# Patient Record
Sex: Female | Born: 1975 | Race: White | Hispanic: No | Marital: Married | Smoking: Former smoker
Health system: Southern US, Community
[De-identification: ages and names within clinical notes are randomized; demographics above are authoritative.]

## PROBLEM LIST (undated history)

## (undated) DIAGNOSIS — F329 Major depressive disorder, single episode, unspecified: Secondary | ICD-10-CM

## (undated) DIAGNOSIS — O039 Complete or unspecified spontaneous abortion without complication: Secondary | ICD-10-CM

## (undated) DIAGNOSIS — F32A Depression, unspecified: Secondary | ICD-10-CM

## (undated) DIAGNOSIS — G43909 Migraine, unspecified, not intractable, without status migrainosus: Secondary | ICD-10-CM

## (undated) DIAGNOSIS — F419 Anxiety disorder, unspecified: Secondary | ICD-10-CM

## (undated) DIAGNOSIS — F909 Attention-deficit hyperactivity disorder, unspecified type: Secondary | ICD-10-CM

## (undated) HISTORY — PX: OVARIAN CYST REMOVAL: SHX89

## (undated) HISTORY — DX: Complete or unspecified spontaneous abortion without complication: O03.9

## (undated) HISTORY — DX: Attention-deficit hyperactivity disorder, unspecified type: F90.9

## (undated) HISTORY — DX: Anxiety disorder, unspecified: F41.9

## (undated) HISTORY — DX: Major depressive disorder, single episode, unspecified: F32.9

## (undated) HISTORY — DX: Depression, unspecified: F32.A

## (undated) HISTORY — DX: Migraine, unspecified, not intractable, without status migrainosus: G43.909

## (undated) HISTORY — PX: DILATION AND CURETTAGE OF UTERUS: SHX78

## (undated) HISTORY — PX: OOPHORECTOMY: SHX86

---

## 1998-06-18 ENCOUNTER — Other Ambulatory Visit: Admission: RE | Admit: 1998-06-18 | Discharge: 1998-06-18 | Payer: Self-pay | Admitting: Obstetrics & Gynecology

## 1999-07-13 DIAGNOSIS — O039 Complete or unspecified spontaneous abortion without complication: Secondary | ICD-10-CM

## 1999-07-13 HISTORY — DX: Complete or unspecified spontaneous abortion without complication: O03.9

## 2000-01-20 ENCOUNTER — Other Ambulatory Visit: Admission: RE | Admit: 2000-01-20 | Discharge: 2000-01-20 | Payer: Self-pay | Admitting: Obstetrics & Gynecology

## 2000-01-27 ENCOUNTER — Encounter: Admission: RE | Admit: 2000-01-27 | Discharge: 2000-01-27 | Payer: Self-pay | Admitting: Obstetrics & Gynecology

## 2000-01-27 ENCOUNTER — Encounter: Payer: Self-pay | Admitting: Obstetrics & Gynecology

## 2000-02-11 ENCOUNTER — Ambulatory Visit (HOSPITAL_COMMUNITY): Admission: RE | Admit: 2000-02-11 | Discharge: 2000-02-11 | Payer: Self-pay | Admitting: Obstetrics & Gynecology

## 2000-02-11 ENCOUNTER — Encounter: Payer: Self-pay | Admitting: Obstetrics & Gynecology

## 2000-03-23 ENCOUNTER — Ambulatory Visit (HOSPITAL_COMMUNITY): Admission: RE | Admit: 2000-03-23 | Discharge: 2000-03-23 | Payer: Self-pay | Admitting: Obstetrics & Gynecology

## 2000-03-23 ENCOUNTER — Encounter (INDEPENDENT_AMBULATORY_CARE_PROVIDER_SITE_OTHER): Payer: Self-pay | Admitting: Specialist

## 2001-07-03 ENCOUNTER — Inpatient Hospital Stay (HOSPITAL_COMMUNITY): Admission: AD | Admit: 2001-07-03 | Discharge: 2001-07-03 | Payer: Self-pay | Admitting: Obstetrics & Gynecology

## 2001-07-10 ENCOUNTER — Inpatient Hospital Stay (HOSPITAL_COMMUNITY): Admission: AD | Admit: 2001-07-10 | Discharge: 2001-07-12 | Payer: Self-pay | Admitting: Obstetrics and Gynecology

## 2001-08-11 ENCOUNTER — Other Ambulatory Visit: Admission: RE | Admit: 2001-08-11 | Discharge: 2001-08-11 | Payer: Self-pay | Admitting: Obstetrics & Gynecology

## 2001-11-30 ENCOUNTER — Encounter: Payer: Self-pay | Admitting: Orthopedic Surgery

## 2001-11-30 ENCOUNTER — Ambulatory Visit (HOSPITAL_COMMUNITY): Admission: RE | Admit: 2001-11-30 | Discharge: 2001-11-30 | Payer: Self-pay | Admitting: Orthopedic Surgery

## 2002-02-04 ENCOUNTER — Ambulatory Visit (HOSPITAL_COMMUNITY): Admission: RE | Admit: 2002-02-04 | Discharge: 2002-02-04 | Payer: Self-pay | Admitting: *Deleted

## 2002-02-05 ENCOUNTER — Ambulatory Visit (HOSPITAL_COMMUNITY): Admission: RE | Admit: 2002-02-05 | Discharge: 2002-02-05 | Payer: Self-pay | Admitting: Internal Medicine

## 2002-02-05 ENCOUNTER — Encounter: Payer: Self-pay | Admitting: Internal Medicine

## 2002-03-02 ENCOUNTER — Encounter: Admission: RE | Admit: 2002-03-02 | Discharge: 2002-03-02 | Payer: Self-pay | Admitting: Internal Medicine

## 2002-03-02 ENCOUNTER — Encounter: Payer: Self-pay | Admitting: Internal Medicine

## 2002-06-21 ENCOUNTER — Encounter (INDEPENDENT_AMBULATORY_CARE_PROVIDER_SITE_OTHER): Payer: Self-pay

## 2002-06-21 ENCOUNTER — Ambulatory Visit (HOSPITAL_COMMUNITY): Admission: AD | Admit: 2002-06-21 | Discharge: 2002-06-21 | Payer: Self-pay | Admitting: Obstetrics and Gynecology

## 2002-07-18 ENCOUNTER — Other Ambulatory Visit: Admission: RE | Admit: 2002-07-18 | Discharge: 2002-07-18 | Payer: Self-pay | Admitting: Obstetrics and Gynecology

## 2003-12-16 ENCOUNTER — Other Ambulatory Visit: Admission: RE | Admit: 2003-12-16 | Discharge: 2003-12-16 | Payer: Self-pay | Admitting: Obstetrics and Gynecology

## 2005-01-27 ENCOUNTER — Other Ambulatory Visit: Admission: RE | Admit: 2005-01-27 | Discharge: 2005-01-27 | Payer: Self-pay | Admitting: Obstetrics and Gynecology

## 2005-01-28 ENCOUNTER — Encounter (INDEPENDENT_AMBULATORY_CARE_PROVIDER_SITE_OTHER): Payer: Self-pay | Admitting: *Deleted

## 2005-01-28 ENCOUNTER — Other Ambulatory Visit: Admission: RE | Admit: 2005-01-28 | Discharge: 2005-01-28 | Payer: Self-pay | Admitting: Obstetrics and Gynecology

## 2005-01-28 ENCOUNTER — Ambulatory Visit (HOSPITAL_COMMUNITY): Admission: RE | Admit: 2005-01-28 | Discharge: 2005-01-28 | Payer: Self-pay | Admitting: Obstetrics and Gynecology

## 2006-08-04 ENCOUNTER — Encounter (INDEPENDENT_AMBULATORY_CARE_PROVIDER_SITE_OTHER): Payer: Self-pay | Admitting: Specialist

## 2006-08-04 ENCOUNTER — Ambulatory Visit (HOSPITAL_BASED_OUTPATIENT_CLINIC_OR_DEPARTMENT_OTHER): Admission: RE | Admit: 2006-08-04 | Discharge: 2006-08-04 | Payer: Self-pay | Admitting: Obstetrics and Gynecology

## 2009-02-06 ENCOUNTER — Ambulatory Visit (HOSPITAL_COMMUNITY): Admission: RE | Admit: 2009-02-06 | Discharge: 2009-02-06 | Payer: Self-pay | Admitting: Obstetrics and Gynecology

## 2009-02-06 ENCOUNTER — Encounter (INDEPENDENT_AMBULATORY_CARE_PROVIDER_SITE_OTHER): Payer: Self-pay | Admitting: Obstetrics and Gynecology

## 2010-08-02 ENCOUNTER — Encounter: Payer: Self-pay | Admitting: Obstetrics and Gynecology

## 2010-10-18 LAB — CBC
Hemoglobin: 14.1 g/dL (ref 12.0–15.0)
MCHC: 34.1 g/dL (ref 30.0–36.0)
RBC: 4.22 MIL/uL (ref 3.87–5.11)
RDW: 12.3 % (ref 11.5–15.5)
WBC: 7.3 10*3/uL (ref 4.0–10.5)

## 2010-10-18 LAB — PREGNANCY, URINE: Preg Test, Ur: NEGATIVE

## 2010-11-24 NOTE — Op Note (Signed)
Deborah Mcpherson, Deborah Mcpherson               ACCOUNT NO.:  [id number]   MEDICAL RECORD NO.:  [id number]          PATIENT TYPE:  AMB   LOCATION:  [facility]                           FACILITY:  [facility]   PHYSICIAN:  Malva Limes, M.D.    DATE OF BIRTH:  Dec 25, 1975   DATE OF PROCEDURE:  02/06/2009  DATE OF DISCHARGE:  02/06/2009                               OPERATIVE REPORT   PREOPERATIVE DIAGNOSES:  1. Painful right ovarian cyst.  2. History of recurrent ovarian cyst.   POSTOPERATIVE DIAGNOSES:  1. Painful right ovarian cyst.  2. History of recurrent ovarian cyst.   PROCEDURES:  1. Diagnostic laparoscopy.  2. Right ovarian cystectomy.  3. Placement of Mirena intrauterine device.   SURGEON:  Malva Limes, MD   ANESTHESIA:  General.   ANTIBIOTICS:  Ancef 1 g.   DRAINS:  Red rubber catheter to the bladder.   SPECIMEN:  Cyst wall sent to Pathology.   DESCRIPTION OF PROCEDURE:  The patient was taken to the operating room,  where general anesthetic was administered without complications.  She  was then placed in dorsal lithotomy position.  She was prepped and  draped in the usual fashion for this procedure.  Her bladder was drained  with a red rubber catheter.  A Hulka tenaculum was applied to the  anterior cervical lip.  Her umbilicus was injected with 0.25% Marcaine.  A vertical skin incision was made in the area of the previous scar.  The  fascia was grasped, entered sharply with Mayo scissors.  The parietal  peritoneum was grasped and also entered sharply with Mayo scissors.  0  Vicryl suture was then placed in a purse-string fashion.  The Hasson  cannula was then placed in the abdominal cavity and 3 L of carbon  dioxide was insufflated.  The liver and gallbladder appeared to be  normal.  The appendix was not visualized.  The patient had no evidence  of any adhesions or endometriosis.  Uterus appeared to be normal.  Her  left ovary was surgically absent.  The right ovary appeared to be  enlarged, somewhat boggy, and a cyst was noted deep inside the ovary.  The right fallopian tube was normal.  It appeared that there was a small  hydatid cyst of Morgagni on the left.  At this point, a 5-mm trocar was  placed in the suprapubic region.  The ovaries grasped and the serosa of  the ovary entered with the scissors.  During the dissection of the cyst  from the ovary,  the cyst was ruptured and serosanguineous fluid was  noted.  At this point, the single tooth graspers were used to grab the  ovarian cyst and removed the cyst wall from the ovary.  Bleeding was  minimal.  The ovary was freed and appeared to be only having minimal  bleeding.  This concluded the laparoscopic procedure.  The instruments  were removed.  Pneumoperitoneum was released.  The fascia was closed  with the previous 0 Vicryl suture placed in a purse-string fashion.  The  skin was closed with interrupted 3-0 Vicryl sutures in  subcuticular  fashion.  The 5-mm port was closed with Dermabond.  At this point, the  Hulka tenaculum was removed.  A single-tooth tenaculum was applied in  the anterior cervical lip.  The uterus was sounded to 8 cm.  The Mirena  IUD placed into the uterine cavity.  The string was trimmed and the  procedure concluded.  The patient tolerated the procedure well.  She was  taken to recovery room in stable condition.  Instrument and counts  correct x1.  The patient was discharged to home.  She will follow up in  the office in 4 weeks.           ______________________________  Malva Limes, M.D.     MA/MEDQ  D:  02/06/2009  T:  02/07/2009  Job:  [id number]

## 2010-11-27 NOTE — Op Note (Signed)
Deborah Mcpherson, Deborah Mcpherson               ACCOUNT NO.:  [id number]   MEDICAL RECORD NO.:  [id number]          PATIENT TYPE:  AMB   LOCATION:  [facility]C                           FACILITY:  [facility]   PHYSICIAN:  Malva Limes, M.D.    DATE OF BIRTH:  May 16, 1976   DATE OF PROCEDURE:  01/28/2005  DATE OF DISCHARGE:                                 OPERATIVE REPORT   PREOPERATIVE DIAGNOSIS:  Menorrhagia.   POSTOPERATIVE DIAGNOSIS:  Menorrhagia.   PROCEDURE:  Dilation and curettage.   SURGEON:  Malva Limes, M.D.   ANESTHESIA:  MAC with paracervical block.   DRAINS:  None.   ANTIBIOTICS:  Ancef 1 g.   COMPLICATIONS:  None.   SPECIMENS:  Endometrial curettings sent to pathology.   ESTIMATED BLOOD LOSS:  20 mL.   PROCEDURE:  The patient was taken to the operating room, where she was  placed in the dorsal lithotomy position.  She was prepped with Betadine and  draped in the usual fashion for this procedure.  A sterile speculum was  placed in the vagina and 20 mL of 1% lidocaine was used for a paracervical  block.  The cervix was then serially dilated to a 23 [place].  The uterus was  sounded to 7 cm.  Sharp curettage was then performed.  There was no evidence  of any kind of polyps or submucosal fibroids.  The patient tolerated the  procedure well, and she was taken to the recovery room in stable condition.  Instrument and lap counts correct x2.  The patient will be discharged home.  She will be sent home with Motrin 600 mg p.o. q.6h. p.r.n.       MA/MEDQ  D:  01/28/2005  T:  01/28/2005  Job:  [id number]

## 2010-11-27 NOTE — Op Note (Signed)
[name], Deborah Mcpherson               ACCOUNT NO.:  [id number]   MEDICAL RECORD NO.:  [id number]          PATIENT TYPE:  AMB   LOCATION:  [facility]                         FACILITY:  [facility]   PHYSICIAN:  Malva Limes, M.D.    DATE OF BIRTH:  1976/03/29   DATE OF PROCEDURE:  08/04/2006  DATE OF DISCHARGE:                               OPERATIVE REPORT   PREOPERATIVE DIAGNOSES:  1. Pelvic pain.  2. Recurring left ovarian cyst.   POSTOPERATIVE DIAGNOSES:  1. Pelvic pain.  2. Recurring left ovarian cyst.   PROCEDURE:  Left oophorectomy.   SURGEON:  Malva Limes, M.D.   ANESTHESIA:  General endotracheal.   ANTIBIOTICS:  Ancef 1 g.   DRAINS:  Red rubber catheter to bladder.   ESTIMATED BLOOD LOSS:  Minimal.   SPECIMENS:  Left ovary, sent to Pathology.   COMPLICATIONS:  None.   PROCEDURE:  The patient was taken to the operating room, where she was  placed in the dorsal supine position.  A general anesthetic was  administered without complication.  She was then placed in the dorsal  lithotomy position.  She was prepped with Hibiclens and draped in the  usual fashion for this procedure.  Her bladder was drained with a red  rubber catheter.  A Hulka tenaculum was applied to the anterior cervical  lip.  At this point, 7 mL of 0.5% Marcaine were injected into the  umbilicus.  A vertical skin incision was made; this was carried down to  the fascia. The fascia was grasped with Kochers, opened with the Mayos  and 0 Vicryl suture placed.  The parietal peritoneum was entered  bluntly.  The Hasson cannula was then placed into the peritoneal cavity  and 3 L of carbon dioxide were insufflated.  The patient was then placed  in Trendelenburg.  A 5-mm port was placed in the suprapubic region under  direct visualization.  A 5-mm port was also placed in the left lower  quadrant under direct visualization.  On examination, the patient was  found to have a 3-cm cyst involving the left ovary.  There  was no  evidence of any endometriosis or adhesions.  The ovarian cyst appeared  to be simple and benign.  Prior to having this procedure performed, it  was suggested to the patient that she not have this surgery and to  follow this because it was likely a follicular cyst.  Also, she was  advised to have an ovarian cystectomy.  The patient states that she  continues to have these and desired an oophorectomy.  The ovary was then  grasped and the tripolar instrument used.  The ovarian pedicle was then  cauterized and transected.  The mesosalpinx was then cauterized and  transected.  The ovary was then placed in the posterior cul-de-sac.  The  5-mm trocar was exchanged with a 10-mm trocar in the suprapubic region.  An Endopouch was placed into the abdominal cavity, the ovary placed into  the Endopouch and then pulled up through the abdominal wall.  Once this  was performed, the pelvis was copiously irrigated and  there was no  evidence of any bleeding.  This concluded the procedure.  The  instruments and trocars were removed.  The fascia was closed with 0  Vicryl suture, the skin with Dermabond.  The patient tolerated the  procedure well.  She was extubated and taken to the recovery room in  stable condition.   She will be discharged to home.  She will be sent home with Percocet to  take p.r.n.           ______________________________  Malva Limes, M.D.     MA/MEDQ  D:  08/04/2006  T:  08/04/2006  Job:  [id number]

## 2010-11-27 NOTE — Op Note (Signed)
NAME:  Deborah Mcpherson, Deborah Mcpherson                         ACCOUNT NO.:  [id number]   MEDICAL RECORD NO.:  [id number]                   PATIENT TYPE:  AMB   LOCATION:  [facility]                                 FACILITY:  [facility]   PHYSICIAN:  Malva Limes, M.D.                 DATE OF BIRTH:  August 24, 1975   DATE OF PROCEDURE:  06/21/2002  DATE OF DISCHARGE:                                 OPERATIVE REPORT   PREOPERATIVE DIAGNOSIS:  Painful left ovarian simple cyst, 5 cm.   POSTOPERATIVE DIAGNOSIS:  Painful left ovarian simple cyst, 5 cm.   PROCEDURES:  1. Diagnostic laparoscopy.  2. Left ovarian cystectomy.   SURGEON:  Malva Limes, M.D.   ANESTHESIA:  General endotracheal.   ANTIBIOTICS:  Ancef 1 g.   DRAINS:  Red rubber catheter to bladder.   ESTIMATED BLOOD LOSS:  Minimal.   SPECIMENS:  Cyst sent to pathology.   FINDINGS:  The patient had normal-appearing liver.  The gallbladder was not  visualized.  In the pelvis the patient had no evidence of endometriosis or  adhesions.  The patient did have a left ovarian cyst.  There was normal  serosal cover on the ovary.  The right ovary appeared to be normal.   INDICATIONS:  The patient is a 35 year old white female, G2, P1, who  presented to the office approximately three days ago complaining of  significant pelvic pain.  An ultrasound was performed, which revealed a 5 cm  simple cyst.  The patient was given the option of observing this with pain  medication versus surgery.  Initially the patient decided to follow the cyst  and 24 hours later changed her mind and elected to have surgery and removal  of the cyst.   DESCRIPTION OF PROCEDURE:  The patient was taken to the operating room,  where a general anesthetic was administered without complication.  She was  then placed in the dorsal lithotomy position and prepped with Hibiclens.  Her bladder was drained with a red rubber catheter.  The patient was draped  in the usual fashion for this  procedure.  A Hulka tenaculum was applied to  the anterior cervical lip.  The umbilicus was then injected with 0.25%  Marcaine, a vertical skin incision was made, the Veress needle was placed in  the peritoneal cavity, and 3 L of carbon dioxide was insufflated.  The 10 mm  trocar was then placed in the peritoneal cavity.  The scope was then placed,  and the patient was placed in Trendelenburg.  Then a 5 mm port was placed in  the suprapubic region and in the left lower quadrant under direct  visualization.  At this point the ovarian cyst was grasped and incised with  the scissors.  Following this the cyst was shelled out of the ovary and  removed.  It was drained and then pulled through the scope and sent to  pathology.  The incision in the ovary was left open.  A small amount of  blood in the posterior cul-de-sac  was evacuated.  This concluded the procedure.  The instruments were removed  and pneumoperitoneum released.  The skin incisions were closed with  interrupted 4-0 Vicryl  suture.  The patient was extubated and taken to the  recovery room in stable condition.  The instrument and lap counts were  correct x2.                                               Malva Limes, M.D.    MA/MEDQ  D:  06/21/2002  T:  06/22/2002  Job:  [id number]

## 2010-11-27 NOTE — Op Note (Signed)
[facility]  Patient:    Deborah Mcpherson, Deborah Mcpherson                      MRN: [id number] Proc. Date: 03/23/00 Attending:  Lars Pinks                           Operative Report  PREOPERATIVE DIAGNOSIS:       Missed abortion.  POSTOPERATIVE DIAGNOSIS:      Missed abortion.  OPERATION:                    Dilatation and evacuation.  SURGEON:                      Richard D. Arlyce Dice, M.D.  ASSISTANT:  ANESTHESIA:                   Paracervical block with IV sedation.  ESTIMATED BLOOD LOSS:         20 cc.  FINDINGS:                     Products of conception consistent with a 10-week                               missed AB.  INDICATIONS:                  This is a 35 year old gravida 1 who presented to the office today at [redacted] weeks gestation and was found on ultrasound to have an eight week fetus with no fetal heart tones seen.  The patient had a previous ultrasound four weeks ago at which time a seven week fetus with a positive fetal heart tone was identified.  Since a follow-up ultrasound was done four weeks later and showed poor growth of fetal pole and no fetal heart tones, it was felt that this was consistent with a diagnosis of an inevitable for missed abortion.  DESCRIPTION OF PROCEDURE:     The patient was taken to the operating room and placed in the dorsal lithotomy position.  IV sedation and a paracervical block was placed.  The anterior lip of the cervix was grasped with single-tooth tenaculum.  The internal os was dilated with Shawnie Pons dilators to a #25 and #8 suction curette was introduced into the uterine cavity.  Suction curettage was then performed until the uterine cavity was emptied.  The procedure was then terminated and the patient left the operating room in good condition. DD:  03/23/00 TD:  03/25/00 Job: [id number]

## 2011-01-07 ENCOUNTER — Other Ambulatory Visit: Payer: Self-pay | Admitting: Obstetrics and Gynecology

## 2013-01-04 ENCOUNTER — Other Ambulatory Visit: Payer: Self-pay | Admitting: Obstetrics and Gynecology

## 2013-04-30 ENCOUNTER — Encounter (INDEPENDENT_AMBULATORY_CARE_PROVIDER_SITE_OTHER): Payer: Self-pay

## 2013-04-30 ENCOUNTER — Ambulatory Visit (INDEPENDENT_AMBULATORY_CARE_PROVIDER_SITE_OTHER): Payer: BC Managed Care – PPO | Admitting: Family Medicine

## 2013-04-30 ENCOUNTER — Encounter: Payer: Self-pay | Admitting: Family Medicine

## 2013-04-30 VITALS — BP 122/74 | HR 83 | Temp 98.0°F | Resp 18 | Ht 68.0 in | Wt 175.0 lb

## 2013-04-30 DIAGNOSIS — J029 Acute pharyngitis, unspecified: Secondary | ICD-10-CM

## 2013-04-30 DIAGNOSIS — J209 Acute bronchitis, unspecified: Secondary | ICD-10-CM

## 2013-04-30 MED ORDER — HYDROCODONE-HOMATROPINE 5-1.5 MG/5ML PO SYRP: ORAL_SOLUTION | ORAL | Status: DC

## 2013-04-30 NOTE — Patient Instructions (Signed)
Saline nasal spray: 2-3 sprays each nostril as needed during the day, esp before bedtime.  OTC mucinex as directed.  Use the prescription cough med at bedtime preferably.  Drink plenty of clear fluids and REST.

## 2013-04-30 NOTE — Assessment & Plan Note (Signed)
Saline nasal spray: 2-3 sprays each nostril as needed during the day, esp before bedtime.  OTC mucinex as directed.  Use the prescription cough med at bedtime preferably.  Drink plenty of clear fluids and REST.  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

## 2013-04-30 NOTE — Progress Notes (Addendum)
Office Note 04/30/2013  CC:  Chief Complaint  Patient presents with  . Cough    chest congestion x 2 days  . Sore Throat    HPI:  Deborah Mcpherson is a 37 y.o. White female who is here to establish care and discuss respiratory symptoms. Patient's most recent primary MD: Dr. Yehuda Budd Old records were not reviewed prior to or during today's visit.  About 48h ST, sinus drainage, coughing a lot, voice going out.  ST improving.  No fever. No rash.  Some HA yesterday due to lots of coughing.  No face or upper teeth pain. No n/v/d.  No wheezing, chest tightness, or SOB. Tried mucinex tabs--no help.   Past Medical History  Diagnosis Date  . Depression   . ADHD (attention deficit hyperactivity disorder)   . Anxiety   . Migraine syndrome     stress is trigger    Past Surgical History  Procedure Laterality Date  . Oophorectomy Left     tube and ovary removed--all benign  . Ovarian cyst removal      x 3    Family History  Problem Relation Age of Onset  . Cancer Father     LUNG  . Alzheimer's disease Maternal Grandfather     History   Social History  . Marital Status: Married    Spouse Name: N/A    Number of Children: N/A  . Years of Education: N/A   Occupational History  . Not on file.   Social History Main Topics  . Smoking status: Former Smoker    Types: Cigarettes    Quit date: 04/29/2009  . Smokeless tobacco: Never Used  . Alcohol Use: 0.6 oz/week    1 Glasses of wine per week  . Drug Use: No  . Sexual Activity: Not on file   Other Topics Concern  . Not on file   Social History Narrative   Married, has 66 y/o daughter.   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   Orig from [place] area.   Tob: 5 pack-yr hx, quit 2010.   Alcohol: occ glass of wine.    No drugs.    Outpatient Encounter Prescriptions as of 04/30/2013  Medication Sig Dispense Refill  . alprazolam (XANAX) 2 MG tablet Take 2 mg by mouth at bedtime as needed for sleep.      Marland Kitchen  amphetamine-dextroamphetamine (ADDERALL) 30 MG tablet Take 30 mg by mouth 2 (two) times daily.      Marland Kitchen buPROPion (WELLBUTRIN SR) 150 MG 12 hr tablet Take 150 mg by mouth 2 (two) times daily.      . drospirenone-ethinyl estradiol (YASMIN,ZARAH,SYEDA) 3-0.03 MG tablet Take 1 tablet by mouth daily.      . DULoxetine (CYMBALTA) 60 MG capsule Take 60 mg by mouth daily.      Marland Kitchen HYDROcodone-homatropine (HYCODAN) 5-1.5 MG/5ML syrup 1-2 tsp po q6h prn cough  120 mL  0   No facility-administered encounter medications on file as of 04/30/2013.    Allergies  Allergen Reactions  . Eggs Or Egg-Derived Products Swelling    Tongue and lips swell  . Penicillins Hives    ROS See HPI PE; Blood pressure 122/74, pulse 83, temperature 98 F (36.7 C), temperature source Temporal, resp. rate 18, height 5\' 8"  (1.727 m), weight 175 lb (79.379 kg), last menstrual period 04/11/2013, SpO2 97.00%. VS: noted--normal. Gen: alert, NAD, NONTOXIC APPEARING. HEENT: eyes without injection, drainage, or swelling.  Ears: EACs clear, TMs with normal light reflex and  landmarks.  Nose: Clear rhinorrhea, with some dried, crusty exudate adherent to mildly injected mucosa.  No purulent d/c.  No paranasal sinus TTP.  No facial swelling.  Throat and mouth without focal lesion.  No pharyngial swelling, erythema, or exudate.   Neck: supple, no LAD.   LUNGS: CTA bilat, nonlabored resps.   CV: RRR, no m/r/g. EXT: no c/c/e SKIN: no rash   Pertinent labs:  Rapid strep NEG  ASSESSMENT AND PLAN:   New pt: obtain old records.  Acute bronchitis Saline nasal spray: 2-3 sprays each nostril as needed during the day, esp before bedtime.  OTC mucinex as directed.  Use the prescription cough med at bedtime preferably.  Drink plenty of clear fluids and REST.  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  Throat culture was sent today.  An After Visit Summary was printed and given to the patient.  Return if symptoms worsen or fail to  improve.

## 2013-05-02 LAB — CULTURE, GROUP A STREP: Organism ID, Bacteria: NORMAL

## 2013-05-03 ENCOUNTER — Encounter: Payer: Self-pay | Admitting: Family Medicine

## 2014-01-08 ENCOUNTER — Other Ambulatory Visit: Payer: Self-pay | Admitting: Obstetrics and Gynecology

## 2014-01-09 LAB — CYTOLOGY - PAP

## 2015-01-07 ENCOUNTER — Ambulatory Visit (INDEPENDENT_AMBULATORY_CARE_PROVIDER_SITE_OTHER): Payer: Self-pay | Admitting: Family Medicine

## 2015-01-07 ENCOUNTER — Telehealth: Payer: Self-pay | Admitting: Family Medicine

## 2015-01-07 DIAGNOSIS — Z23 Encounter for immunization: Secondary | ICD-10-CM

## 2015-01-07 NOTE — Telephone Encounter (Signed)
Pt called stating that she dropped a knife on her foot.  She is fine but doesn't think she is up to date on her tdap.  Can we give this as nurse visit?  Please advise.

## 2015-01-07 NOTE — Telephone Encounter (Signed)
yes

## 2015-03-24 ENCOUNTER — Other Ambulatory Visit: Payer: Self-pay | Admitting: Obstetrics and Gynecology

## 2015-03-25 LAB — CYTOLOGY - PAP

## 2015-04-29 ENCOUNTER — Ambulatory Visit (INDEPENDENT_AMBULATORY_CARE_PROVIDER_SITE_OTHER): Payer: BLUE CROSS/BLUE SHIELD | Admitting: Family Medicine

## 2015-04-29 ENCOUNTER — Encounter: Payer: Self-pay | Admitting: Family Medicine

## 2015-04-29 VITALS — BP 113/79 | HR 91 | Temp 97.7°F | Resp 18 | Ht 68.0 in | Wt 171.0 lb

## 2015-04-29 DIAGNOSIS — J019 Acute sinusitis, unspecified: Secondary | ICD-10-CM | POA: Insufficient documentation

## 2015-04-29 DIAGNOSIS — J0100 Acute maxillary sinusitis, unspecified: Secondary | ICD-10-CM

## 2015-04-29 MED ORDER — HYDROCODONE-HOMATROPINE 5-1.5 MG/5ML PO SYRP: 5.0000 mL | ORAL_SOLUTION | Freq: Four times a day (QID) | ORAL | Status: DC | PRN

## 2015-04-29 MED ORDER — DOXYCYCLINE HYCLATE 100 MG PO TABS: 100.0000 mg | ORAL_TABLET | Freq: Two times a day (BID) | ORAL | Status: DC

## 2015-04-29 MED ORDER — BENZONATATE 100 MG PO CAPS: 100.0000 mg | ORAL_CAPSULE | Freq: Two times a day (BID) | ORAL | Status: DC | PRN

## 2015-04-29 MED ORDER — FLUTICASONE PROPIONATE 50 MCG/ACT NA SUSP: 2.0000 | Freq: Every day | NASAL | Status: DC

## 2015-04-29 NOTE — Patient Instructions (Signed)
Sinusitis, Adult Sinusitis is redness, soreness, and inflammation of the paranasal sinuses. Paranasal sinuses are air pockets within the bones of your face. They are located beneath your eyes, in the middle of your forehead, and above your eyes. In healthy paranasal sinuses, mucus is able to drain out, and air is able to circulate through them by way of your nose. However, when your paranasal sinuses are inflamed, mucus and air can become trapped. This can allow bacteria and other germs to grow and cause infection. Sinusitis can develop quickly and last only a short time (acute) or continue over a long period (chronic). Sinusitis that lasts for more than 12 weeks is considered chronic. CAUSES Causes of sinusitis include:  Allergies.  Structural abnormalities, such as displacement of the cartilage that separates your nostrils (deviated septum), which can decrease the air flow through your nose and sinuses and affect sinus drainage.  Functional abnormalities, such as when the small hairs (cilia) that line your sinuses and help remove mucus do not work properly or are not present. SIGNS AND SYMPTOMS Symptoms of acute and chronic sinusitis are the same. The primary symptoms are pain and pressure around the affected sinuses. Other symptoms include:  Upper toothache.  Earache.  Headache.  Bad breath.  Decreased sense of smell and taste.  A cough, which worsens when you are lying flat.  Fatigue.  Fever.  Thick drainage from your nose, which often is green and may contain pus (purulent).  Swelling and warmth over the affected sinuses. DIAGNOSIS Your health care provider will perform a physical exam. During your exam, your health care provider may perform any of the following to help determine if you have acute sinusitis or chronic sinusitis:  Look in your nose for signs of abnormal growths in your nostrils (nasal polyps).  Tap over the affected sinus to check for signs of  infection.  View the inside of your sinuses using an imaging device that has a light attached (endoscope). If your health care provider suspects that you have chronic sinusitis, one or more of the following tests may be recommended:  Allergy tests.  Nasal culture. A sample of mucus is taken from your nose, sent to a lab, and screened for bacteria.  Nasal cytology. A sample of mucus is taken from your nose and examined by your health care provider to determine if your sinusitis is related to an allergy. TREATMENT Most cases of acute sinusitis are related to a viral infection and will resolve on their own within 10 days. Sometimes, medicines are prescribed to help relieve symptoms of both acute and chronic sinusitis. These may include pain medicines, decongestants, nasal steroid sprays, or saline sprays. However, for sinusitis related to a bacterial infection, your health care provider will prescribe antibiotic medicines. These are medicines that will help kill the bacteria causing the infection. Rarely, sinusitis is caused by a fungal infection. In these cases, your health care provider will prescribe antifungal medicine. For some cases of chronic sinusitis, surgery is needed. Generally, these are cases in which sinusitis recurs more than 3 times per year, despite other treatments. HOME CARE INSTRUCTIONS  Drink plenty of water. Water helps thin the mucus so your sinuses can drain more easily.  Use a humidifier.  Inhale steam 3-4 times a day (for example, sit in the bathroom with the shower running).  Apply a warm, moist washcloth to your face 3-4 times a day, or as directed by your health care provider.  Use saline nasal sprays to help   moisten and clean your sinuses.  Take medicines only as directed by your health care provider.  If you were prescribed either an antibiotic or antifungal medicine, finish it all even if you start to feel better. SEEK IMMEDIATE MEDICAL CARE IF:  You have  increasing pain or severe headaches.  You have nausea, vomiting, or drowsiness.  You have swelling around your face.  You have vision problems.  You have a stiff neck.  You have difficulty breathing.   This information is not intended to replace advice given to you by your health care provider. Make sure you discuss any questions you have with your health care provider.   Document Released: 06/28/2005 Document Revised: 07/19/2014 Document Reviewed: 07/13/2011 Elsevier Interactive Patient Education 2016 [employer] Doxycycline  10 days, Mucinex, flonase Tessalon Perles for cough in the day Cough syrup at night Nasal saline at least 3 times a day

## 2015-04-29 NOTE — Progress Notes (Signed)
Pre visit review using our clinic review tool, if applicable. No additional management support is needed unless otherwise documented below in the visit note. 

## 2015-04-29 NOTE — Progress Notes (Signed)
   Subjective:    Patient ID: Deborah Mcpherson, female    DOB: [birth year], 39 y.o.   MRN: [id number]  HPI  Congestion: patient stated last week she experienced nasal congestion and rhinorrhea that went away. She was exposed to her neighbors children at that time that had been ill. She started to feel better and then Sunday she experienced sinus pressure right max. Sinus and Monday cough. She has lost her voice on Sunday. She can not sleep because she is woke by cough. She denies current fever, chill, nausea, vomit, diarrhea or rash. No sore throat or mylagia. She has tried motrin and mucinex.   Former Smoker  Past Medical History  Diagnosis Date  . Depression     Pristique and lexapro in past  . ADHD (attention deficit hyperactivity disorder)   . Anxiety   . Migraine syndrome     stress is trigger   Allergies  Allergen Reactions  . Eggs Or Egg-Derived Products Swelling    Tongue and lips swell  . Penicillins Hives      Review of Systems Negative, with the exception of above mentioned in HPI     Objective:   Physical Exam BP 113/79 mmHg  Pulse 91  Temp(Src) 97.7 F (36.5 C) (Temporal)  Resp 18  Ht 5\' 8"  (1.727 m)  Wt 171 lb (77.565 kg)  BMI 26.01 kg/m2  SpO2 98% Gen: Afebrile. No acute distress. Non toxic in appearance. Fatigued appearing HENT: AT. [place]. Bilateral TM visualized and normal in appearance. MMM. Bilateral nares with severe erythema dn mild swelling. . Throat without erythema or exudates. Cough and hoarseness present on exam. TTP maxillary sinus Eyes:Pupils Equal Round Reactive to light, Extraocular movements intact,  Conjunctiva without redness, discharge or icterus. Neck/lymp/endocrine: Supple, bilateral cervical anterior lymphadenopathy CV: RRR  Chest: CTAB, no wheeze or crackles Abd: Soft. round. NTND. BS +. No  Masses palpated.  Skin: No rashes, purpura or petechiae.     Assessment & Plan:  1. Acute maxillary sinusitis, recurrence not specified - Abx,   Mucinex, flonase, Tessalon, hycodan, Nasal saline - doxycycline (VIBRA-TABS) 100 MG tablet; Take 1 tablet (100 mg total) by mouth 2 (two) times daily.  Dispense: 20 tablet; Refill: 0 - fluticasone (FLONASE) 50 MCG/ACT nasal spray; Place 2 sprays into both nostrils daily.  Dispense: 16 g; Refill: 6 - HYDROcodone-homatropine (HYCODAN) 5-1.5 MG/5ML syrup; Take 5 mLs by mouth every 6 (six) hours as needed for cough.  Dispense: 120 mL; Refill: 0 - benzonatate (TESSALON) 100 MG capsule; Take 1 capsule (100 mg total) by mouth 2 (two) times daily as needed for cough.  Dispense: 20 capsule; Refill: 0

## 2015-08-28 ENCOUNTER — Telehealth: Payer: Self-pay | Admitting: *Deleted

## 2015-08-28 MED ORDER — OSELTAMIVIR PHOSPHATE 75 MG PO CAPS: ORAL_CAPSULE | ORAL | Status: DC

## 2015-08-28 NOTE — Telephone Encounter (Signed)
Please advise. Thanks.  

## 2015-08-28 NOTE — Telephone Encounter (Signed)
Pt advised and voiced understanding.   

## 2015-08-28 NOTE — Telephone Encounter (Signed)
Patient called left message stating her daughter was Dx by pediatrician with positive influenza A . She states they told her to call her PCP to get Tamiflu for herself since she is allergic to Flu shot and has been exposed. She is requesting an RX for Tamiflu as a preventative measure. She was last seen here 04/29/15 for sinusitis. Please advise

## 2015-08-28 NOTE — Telephone Encounter (Signed)
OK, tamiflu eRx'd per pt's request.

## 2015-11-28 DIAGNOSIS — F900 Attention-deficit hyperactivity disorder, predominantly inattentive type: Secondary | ICD-10-CM | POA: Diagnosis not present

## 2015-11-28 DIAGNOSIS — F3342 Major depressive disorder, recurrent, in full remission: Secondary | ICD-10-CM | POA: Diagnosis not present

## 2016-03-18 ENCOUNTER — Encounter: Payer: Self-pay | Admitting: Family Medicine

## 2016-03-18 ENCOUNTER — Ambulatory Visit (INDEPENDENT_AMBULATORY_CARE_PROVIDER_SITE_OTHER): Payer: BLUE CROSS/BLUE SHIELD | Admitting: Family Medicine

## 2016-03-18 ENCOUNTER — Telehealth: Payer: Self-pay | Admitting: Family Medicine

## 2016-03-18 VITALS — BP 109/72 | HR 86 | Temp 98.6°F | Wt 155.0 lb

## 2016-03-18 DIAGNOSIS — S40261A Insect bite (nonvenomous) of right shoulder, initial encounter: Secondary | ICD-10-CM

## 2016-03-18 DIAGNOSIS — W57XXXA Bitten or stung by nonvenomous insect and other nonvenomous arthropods, initial encounter: Secondary | ICD-10-CM

## 2016-03-18 MED ORDER — DOXYCYCLINE HYCLATE 100 MG PO TABS: 100.0000 mg | ORAL_TABLET | Freq: Once | ORAL | 0 refills | Status: AC

## 2016-03-18 NOTE — Progress Notes (Signed)
Deborah Pentamanda Q Rotert , May 22, 1976, 40 y.o., female MRN: [id number] Patient Care Team    Relationship Specialty Notifications Start End  Jeoffrey MassedPhilip H McGowen, MD PCP - General Family Medicine  04/30/13     CC: engorged tick Subjective: Pt presents for an acute OV with complaints of finding/removing engorged large tick this morning. She feels it has been then 2 days. 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 She denies nausea, vomit, diarrhea, rash, fever, chills or headache.   Allergies  Allergen Reactions  . Eggs Or Egg-Derived Products Swelling    Tongue and lips swell  . Penicillins Hives   Social History  Substance Use Topics  . Smoking status: Former Smoker    Types: Cigarettes    Quit date: 04/29/2009  . Smokeless tobacco: Never Used  . Alcohol use 0.6 oz/week    1 Glasses of wine per week   Past Medical History:  Diagnosis Date  . ADHD (attention deficit hyperactivity disorder)   . Anxiety   . Depression    Pristique and lexapro in past  . Migraine syndrome    stress is trigger   Past Surgical History:  Procedure Laterality Date  . DILATION AND CURETTAGE OF UTERUS     miscarriage  . OOPHORECTOMY Left    tube and ovary removed--all benign  . OVARIAN CYST REMOVAL     x 3   Family History  Problem Relation Age of Onset  . Cancer Father     LUNG  . Alzheimer's disease Maternal Grandfather      Medication List       Accurate as of 03/18/16 12:12 PM. Always use your most recent med list.          alprazolam 2 MG tablet Commonly known as:  XANAX Take 2 mg by mouth at bedtime as needed for sleep.   amphetamine-dextroamphetamine 30 MG tablet Commonly known as:  ADDERALL Take 30 mg by mouth 2 (two) times daily.   buPROPion 150 MG 12 hr tablet Commonly known as:  WELLBUTRIN SR Take 150 mg by mouth 2 (two) times daily.   drospirenone-ethinyl estradiol 3-0.03 MG tablet Commonly known as:   YASMIN,ZARAH,SYEDA Take 1 tablet by mouth daily.       No results found for this or any previous visit (from the past 24 hour(s)). No results found.   ROS: Negative, with the exception of above mentioned in HPI  Objective:  BP 109/72 (BP Location: Right Arm, Patient Position: Sitting, Cuff Size: Normal)   Pulse 86   Temp 98.6 F (37 C)   Wt 155 lb (70.3 kg)   SpO2 99%   BMI 23.57 kg/m  Body mass index is 23.57 kg/m. Gen: Afebrile. No acute distress. Nontoxic in appearance, well developed, well nourished.  HENT: AT. [place]. MMM Eyes:Pupils Equal Round Reactive to light, Extraocular movements intact,  Conjunctiva without redness, discharge or icterus. Skin: No rashes, purpura or petechiae. Rt posterior shoulder insect bite, mild erythema, mild swelling at location (<1 mm)  Neuro: Normal gait. PERLA. EOMi. Alert. Oriented x3  Psych: Normal affect, dress and demeanor. Normal speech. Normal thought content and judgment.  Assessment/Plan: Deborah Mcpherson is a 40 y.o. female present for acute OV for  Tick bite - engorged tick removed, area of bite mildly red/swollen.  - AVS and alarm signs on LYME and RMSF - Prophylaxis dose doxycyline 200 mg once.  - F/u PRN  electronically signed by:  Howard Pouch, DO  [facility] [place]

## 2016-03-18 NOTE — Telephone Encounter (Signed)
It ok with me.

## 2016-03-18 NOTE — Telephone Encounter (Signed)
Pt would like to transfer her care to Dr Claiborne BillingsKuneff, she states she feels more comfortable with female provider.  Okay to transfer?

## 2016-03-18 NOTE — Telephone Encounter (Signed)
OK with me.

## 2016-03-18 NOTE — Patient Instructions (Signed)
[place] Spotted Fever [place] spotted fever is an illness that is spread to people by infected ticks. The illness causes flulike symptoms and a reddish-purple rash. This illness can quickly become very serious. Treatment must be started right away. When the illness is not treated right away, it can sometimes lead to long-term health problems or even death. This illness is most common during warm weather when ticks are most active. CAUSES [place] spotted fever is caused by a type of bacteria that is called Rickettsia rickettsii. This type of bacteria is carried by American dog ticks and [place] wood ticks. People get infected through a bite from a tick that is infected with the bacteria. The bite is painless, and it frequently goes unnoticed. The bacteria can also infect a person when tick blood or tick feces get into a person's body through damaged skin. A tick bite is not necessary for an infection to occur. People can get [place] spotted fever if they get a tick's blood or body fluids on their skin in the area of a small cut or sore. This could happen while removing a tick from another person or a dog. The infection is not contagious, and it cannot be spread (transmitted) from person to person. SIGNS AND SYMPTOMS Symptoms may begin 2-14 days after a tick bite. The most common early symptoms are:  Fever.  Muscle aches.  Headache.  Nausea.  Vomiting.  Poor appetite.  Abdominal pain. The reddish-purple rash usually appears 3-5 days after the first symptoms begin. The rash often starts on the wrists and ankles. It may then spread to the palms, the soles of the feet, the legs, and the trunk. DIAGNOSIS Diagnosis is based on a physical exam, medical history, and blood tests. Your health care provider may suspect [place] spotted fever in one of these cases:   If you have recently been bitten by a tick.  If you have been in areas that have a lot of ticks  or in areas where the disease is common. TREATMENT It is important to begin treatment right away. Treatment will usually involve the use of antibiotic medicines. In some cases, your health care provider may begin treatment before the diagnosis is confirmed. If your symptoms are severe, a hospital stay may be needed. HOME CARE INSTRUCTIONS  Rest as much as possible until you feel better.  Take medicines only as directed by your health care provider.  Take your antibiotic medicine as directed by your health care provider. Finish the antibiotic even if you start to feel better.  Drink enough fluid to keep your urine clear or pale yellow.  Keep all follow-up visits as directed by your health care provider. This is important. PREVENTION Avoiding tick bites can help to prevent this illness. Take these steps to avoid tick bites when you are outdoors:  Be aware that most ticks live in shrubs, low tree branches, and grassy areas. A tick can climb onto your body when you make contact with leaves or grass where the tick is waiting.  Wear protective clothing. Long sleeves and long pants are best.  Wear white clothes so you can see ticks more easily.  Tuck your pant legs into your socks.  If you go walking on a trail, stay in the middle of the trail to avoid brushing against bushes.  Avoid walking through areas that have long grass.  Put insect repellent on all exposed skin and along boot tops, pant legs, and sleeve cuffs.    Check clothing, hair, and skin repeatedly and before going inside.  Check family members and pets for ticks.  Brush off any ticks that are not attached.  Take a shower or a bath as soon as possible after you have been outdoors. Check your skin for ticks. The most common places on the body where ticks attach themselves are the scalp, neck, armpits, waist, and groin. You can also greatly reduce your chances of getting [place] spotted fever if you remove attached  ticks as soon as possible. To remove an attached tick, use a forceps or fine-point tweezers to detach the intact tick without leaving its mouth parts in the skin. The wound from the tick bite should be washed after the tick has been removed. SEEK MEDICAL CARE IF:  You have drainage, swelling, or increased redness or pain in the area of the rash. SEEK IMMEDIATE MEDICAL CARE IF:  You have chest pain.  You have shortness of breath.  You have a severe headache.  You have a seizure.  You have severe abdominal pain.  You are feeling confused.  You are bruising easily.  You have bleeding from your gums.  You have blood in your stool.   This information is not intended to replace advice given to you by your health care provider. Make sure you discuss any questions you have with your health care provider.   Document Released: 10/10/2000 Document Revised: 07/19/2014 Document Reviewed: 02/11/2014 Elsevier Interactive Patient Education 2016 Elsevier Inc.    Lyme Disease Lyme disease is an infection that affects many parts of the body, including the skin, joints, and nervous system. CAUSES Lyme disease is caused by bacteria called Borrelia burgdorferi. You can get Lyme disease by being bitten by an infected tick. The tick must be attached to your skin for at least 36 hours to transmit the infection. Deer often carry infected ticks. RISK FACTORS  Living in or visiting [place], the mid[place] states, or the [place].  Spending time in wooded or grassy areas.  Being outdoors with exposed skin.  Failing to remove a tick from your skin within 3-4 days. SIGNS AND SYMPTOMS  A round, red rash that comes out from the center of the tick bite. This is the first sign of infection. The center of the rash may be blood colored or have tiny blisters.  Fatigue.  Headache.  Chills and fever.  General achiness.  Joint pain, often in the knee.  Swollen lymph  glands. DIAGNOSIS Lyme disease is diagnosed with a medical history, physical exam, and blood test. TREATMENT The main treatment is antibiotic medicine, usually taken by mouth. The length of treatment depends on how soon after a tick bite you begin taking the medicine. In some cases, treatment is necessary for several weeks. If the infection is severe, IV antibiotics may be necessary. HOME CARE INSTRUCTIONS  Take your antibiotic medicine as directed by your health care provider. Finish the antibiotic even if you start to feel better.  You may take a probiotic in between doses of your antibiotic to help avoid stomach upset or diarrhea.  Check with your health care provider before supplementing your treatment. Many alternative therapies have not been proven and may be harmful to you.  Keep all follow-up visits as directed by your health care provider. This is important. PREVENTION Reinfection is possible with another tick bite by an infected tick. Take these precautions to prevent an infection:  Cover your skin with light-colored clothing when outdoors in the   spring and summer months.  Spray clothing and skin with bug spray. The spray should be 20-30% DEET.  Avoided wooded, grassy, and shaded areas.  Remove yard litter, brush, trash, and plants that attract deer and rodents.  Check yourself for ticks when you come indoors.  Wash clothing worn each day.  Check your pets for ticks before they come inside.  If you find a tick:  Remove it with tweezers.  Clean your hands and the bite area with rubbing alcohol or soap and water. Pregnant women should take special care to avoid tick bites because the infection can be passed along to the fetus. SEEK MEDICAL CARE IF:  You have symptoms after treatment.  You have removed a tick and want to bring it to your health care provider for testing. SEEK IMMEDIATE MEDICAL CARE IF:  You have an irregular heartbeat.  You have nerve  pain.  Your face feels numb. MAKE SURE YOU:  Understand these instructions.  Will watch your condition.  Will get help right away if you are not doing well or get worse.   This information is not intended to replace advice given to you by your health care provider. Make sure you discuss any questions you have with your health care provider.   Document Released: 10/04/2000 Document Revised: 07/19/2014 Document Reviewed: 11/13/2013 Elsevier Interactive Patient Education 2016 Elsevier Inc.   

## 2016-03-29 ENCOUNTER — Other Ambulatory Visit: Payer: Self-pay | Admitting: Obstetrics and Gynecology

## 2016-03-29 DIAGNOSIS — Z01419 Encounter for gynecological examination (general) (routine) without abnormal findings: Secondary | ICD-10-CM | POA: Diagnosis not present

## 2016-03-29 DIAGNOSIS — Z6822 Body mass index (BMI) 22.0-22.9, adult: Secondary | ICD-10-CM | POA: Diagnosis not present

## 2016-03-29 DIAGNOSIS — Z124 Encounter for screening for malignant neoplasm of cervix: Secondary | ICD-10-CM | POA: Diagnosis not present

## 2016-03-30 LAB — CYTOLOGY - PAP

## 2016-05-21 DIAGNOSIS — F3342 Major depressive disorder, recurrent, in full remission: Secondary | ICD-10-CM | POA: Diagnosis not present

## 2016-05-21 DIAGNOSIS — F900 Attention-deficit hyperactivity disorder, predominantly inattentive type: Secondary | ICD-10-CM | POA: Diagnosis not present

## 2016-08-24 ENCOUNTER — Encounter: Payer: Self-pay | Admitting: Family Medicine

## 2016-08-24 ENCOUNTER — Ambulatory Visit (INDEPENDENT_AMBULATORY_CARE_PROVIDER_SITE_OTHER): Payer: BLUE CROSS/BLUE SHIELD | Admitting: Family Medicine

## 2016-08-24 VITALS — BP 122/79 | HR 77 | Temp 99.0°F | Resp 20 | Wt 154.0 lb

## 2016-08-24 DIAGNOSIS — J0100 Acute maxillary sinusitis, unspecified: Secondary | ICD-10-CM | POA: Diagnosis not present

## 2016-08-24 MED ORDER — PREDNISONE 50 MG PO TABS: 50.0000 mg | ORAL_TABLET | Freq: Every day | ORAL | 0 refills | Status: DC

## 2016-08-24 MED ORDER — DOXYCYCLINE HYCLATE 100 MG PO TABS: 100.0000 mg | ORAL_TABLET | Freq: Two times a day (BID) | ORAL | 0 refills | Status: DC

## 2016-08-24 NOTE — Progress Notes (Signed)
Deborah Mcpherson , 30-Sep-1975, 41 y.o., female MRN: [id number] Patient Care Team    Relationship Specialty Notifications Start End  Natalia Leatherwoodenee A Kuneff, DO PCP - General Family Medicine  03/18/16     CC: sinus symptoms.  Subjective: Pt presents for an acute OV with complaints of facial pain of > 1 week  duration.  Associated symptoms include cough,facial pain, sinus drainage, nasal congestion, headache. Pt has tried mucinex, motrin this morning.   Allergies  Allergen Reactions  . Eggs Or Egg-Derived Products Swelling    Tongue and lips swell  . Penicillins Hives   Social History  Substance Use Topics  . Smoking status: Former Smoker    Types: Cigarettes    Quit date: 04/29/2009  . Smokeless tobacco: Never Used  . Alcohol use 0.6 oz/week    1 Glasses of wine per week   Past Medical History:  Diagnosis Date  . ADHD (attention deficit hyperactivity disorder)   . Anxiety   . Depression    Pristique and lexapro in past  . Migraine syndrome    stress is trigger   Past Surgical History:  Procedure Laterality Date  . DILATION AND CURETTAGE OF UTERUS     miscarriage  . OOPHORECTOMY Left    tube and ovary removed--all benign  . OVARIAN CYST REMOVAL     x 3   Family History  Problem Relation Age of Onset  . Cancer Father     LUNG  . Alzheimer's disease Maternal Grandfather    Allergies as of 08/24/2016      Reactions   Eggs Or Egg-derived Products Swelling   Tongue and lips swell   Penicillins Hives      Medication List       Accurate as of 08/24/16  1:24 PM. Always use your most recent med list.          alprazolam 2 MG tablet Commonly known as:  XANAX Take 2 mg by mouth at bedtime as needed for sleep.   amphetamine-dextroamphetamine 30 MG tablet Commonly known as:  ADDERALL Take 30 mg by mouth 2 (two) times daily.   buPROPion 150 MG 12 hr tablet Commonly known as:  WELLBUTRIN SR Take 150 mg by mouth 2 (two) times daily.   drospirenone-ethinyl estradiol  3-0.03 MG tablet Commonly known as:  YASMIN,ZARAH,SYEDA Take 1 tablet by mouth daily.       No results found for this or any previous visit (from the past 24 hour(s)). No results found.   ROS: Negative, with the exception of above mentioned in HPI   Objective:  BP 122/79 (BP Location: Left Arm, Patient Position: Sitting, Cuff Size: Normal)   Pulse 77   Temp 99 F (37.2 C)   Resp 20   Wt 154 lb (69.9 kg)   SpO2 98%   BMI 23.42 kg/m  Body mass index is 23.42 kg/m. Gen: Afebrile. No acute distress. Nontoxic in appearance, well developed, well nourished.  HENT: AT. [place]. Bilateral TM visualized wnl. MMM, no oral lesions. Bilateral nares with erythema, drainage, mild swelling. Throat without erythema or exudates. TTP max sinus.  Eyes:Pupils Equal Round Reactive to light, Extraocular movements intact,  Conjunctiva without redness, discharge or icterus. Neck/lymp/endocrine: Supple,no lymphadenopathy CV: RRR Chest: CTAB, no wheeze or crackles. Good air movement, normal resp effort.  Abd: Soft. NTND. BS present.  Skin: no rashes, purpura or petechiae.  Neuro: Normal gait. PERLA. EOMi. Alert. Oriented x3   Assessment/Plan: Deborah Mcpherson is a 41  y.o. female present for acute OV for  Acute maxillary sinusitis, recurrence not specified - rest, hydrate. Mucinex, advil.  - doxycycline (VIBRA-TABS) 100 MG tablet; Take 1 tablet (100 mg total) by mouth 2 (two) times daily.  Dispense: 20 tablet; Refill: 0 - predniSONE (DELTASONE) 50 MG tablet; Take 1 tablet (50 mg total) by mouth daily with breakfast.  Dispense: 5 tablet; Refill: 0 - F/U PRN   electronically signed by:  Felix Pacini, DO  [facility] [place]

## 2016-08-24 NOTE — Patient Instructions (Signed)
Rest, hydrate. Use mucinex/advil etc Try flonase nasal spray for a few weeks to help with sinus. Start doxycyline every 12 hours for 10 days.  Start prednisone once daily with a meal for 5 days.    Sinusitis, Adult Sinusitis is soreness and inflammation of your sinuses. Sinuses are hollow spaces in the bones around your face. They are located:  Around your eyes.  In the middle of your forehead.  Behind your nose.  In your cheekbones. Your sinuses and nasal passages are lined with a stringy fluid (mucus). Mucus normally drains out of your sinuses. When your nasal tissues get inflamed or swollen, the mucus can get trapped or blocked so air cannot flow through your sinuses. This lets bacteria, viruses, and funguses grow, and that leads to infection. Follow these instructions at home: Medicines  Take, use, or apply over-the-counter and prescription medicines only as told by your doctor. These may include nasal sprays.  If you were prescribed an antibiotic medicine, take it as told by your doctor. Do not stop taking the antibiotic even if you start to feel better. Hydrate and Humidify  Drink enough water to keep your pee (urine) clear or pale yellow.  Use a cool mist humidifier to keep the humidity level in your home above 50%.  Breathe in steam for 10-15 minutes, 3-4 times a day or as told by your doctor. You can do this in the bathroom while a hot shower is running.  Try not to spend time in cool or dry air. Rest  Rest as much as possible.  Sleep with your head raised (elevated).  Make sure to get enough sleep each night. General instructions  Put a warm, moist washcloth on your face 3-4 times a day or as told by your doctor. This will help with discomfort.  Wash your hands often with soap and water. If there is no soap and water, use hand sanitizer.  Do not smoke. Avoid being around people who are smoking (secondhand smoke).  Keep all follow-up visits as told by your  doctor. This is important. Contact a doctor if:  You have a fever.  Your symptoms get worse.  Your symptoms do not get better within 10 days. Get help right away if:  You have a very bad headache.  You cannot stop throwing up (vomiting).  You have pain or swelling around your face or eyes.  You have trouble seeing.  You feel confused.  Your neck is stiff.  You have trouble breathing. This information is not intended to replace advice given to you by your health care provider. Make sure you discuss any questions you have with your health care provider. Document Released: 12/15/2007 Document Revised: 02/22/2016 Document Reviewed: 04/23/2015 Elsevier Interactive Patient Education  2017 [employer]

## 2016-11-12 DIAGNOSIS — F900 Attention-deficit hyperactivity disorder, predominantly inattentive type: Secondary | ICD-10-CM | POA: Diagnosis not present

## 2016-11-12 DIAGNOSIS — F4322 Adjustment disorder with anxiety: Secondary | ICD-10-CM | POA: Diagnosis not present

## 2017-04-04 DIAGNOSIS — Z124 Encounter for screening for malignant neoplasm of cervix: Secondary | ICD-10-CM | POA: Diagnosis not present

## 2017-04-04 DIAGNOSIS — Z01419 Encounter for gynecological examination (general) (routine) without abnormal findings: Secondary | ICD-10-CM | POA: Diagnosis not present

## 2017-04-04 DIAGNOSIS — Z6822 Body mass index (BMI) 22.0-22.9, adult: Secondary | ICD-10-CM | POA: Diagnosis not present

## 2017-05-06 DIAGNOSIS — F900 Attention-deficit hyperactivity disorder, predominantly inattentive type: Secondary | ICD-10-CM | POA: Diagnosis not present

## 2017-05-06 DIAGNOSIS — F3342 Major depressive disorder, recurrent, in full remission: Secondary | ICD-10-CM | POA: Diagnosis not present

## 2017-08-26 ENCOUNTER — Ambulatory Visit (INDEPENDENT_AMBULATORY_CARE_PROVIDER_SITE_OTHER): Payer: BLUE CROSS/BLUE SHIELD | Admitting: Family Medicine

## 2017-08-26 ENCOUNTER — Encounter: Payer: Self-pay | Admitting: Family Medicine

## 2017-08-26 VITALS — BP 125/76 | HR 80 | Temp 98.0°F | Resp 20 | Ht 68.0 in | Wt 153.8 lb

## 2017-08-26 DIAGNOSIS — Z79899 Other long term (current) drug therapy: Secondary | ICD-10-CM | POA: Diagnosis not present

## 2017-08-26 DIAGNOSIS — Z131 Encounter for screening for diabetes mellitus: Secondary | ICD-10-CM

## 2017-08-26 DIAGNOSIS — Z114 Encounter for screening for human immunodeficiency virus [HIV]: Secondary | ICD-10-CM | POA: Diagnosis not present

## 2017-08-26 DIAGNOSIS — F909 Attention-deficit hyperactivity disorder, unspecified type: Secondary | ICD-10-CM | POA: Diagnosis not present

## 2017-08-26 DIAGNOSIS — Z130 Encounter for screening for diseases of the blood and blood-forming organs and certain disorders involving the immune mechanism: Secondary | ICD-10-CM

## 2017-08-26 DIAGNOSIS — Z13220 Encounter for screening for lipoid disorders: Secondary | ICD-10-CM | POA: Diagnosis not present

## 2017-08-26 DIAGNOSIS — F419 Anxiety disorder, unspecified: Secondary | ICD-10-CM | POA: Insufficient documentation

## 2017-08-26 DIAGNOSIS — F418 Other specified anxiety disorders: Secondary | ICD-10-CM

## 2017-08-26 DIAGNOSIS — Z0000 Encounter for general adult medical examination without abnormal findings: Secondary | ICD-10-CM | POA: Diagnosis not present

## 2017-08-26 LAB — COMPREHENSIVE METABOLIC PANEL
ALK PHOS: 60 U/L (ref 39–117)
ALT: 12 U/L (ref 0–35)
AST: 12 U/L (ref 0–37)
Albumin: 4.2 g/dL (ref 3.5–5.2)
BUN: 16 mg/dL (ref 6–23)
CHLORIDE: 104 meq/L (ref 96–112)
CO2: 28 mEq/L (ref 19–32)
Calcium: 9.5 mg/dL (ref 8.4–10.5)
Creatinine, Ser: 0.97 mg/dL (ref 0.40–1.20)
GFR: 67.83 mL/min (ref >60.00)
GLUCOSE: 106 mg/dL — AB (ref 70–99)
POTASSIUM: 4.7 meq/L (ref 3.5–5.1)
SODIUM: 138 meq/L (ref 135–145)
Total Bilirubin: 0.5 mg/dL (ref 0.2–1.2)
Total Protein: 6.4 g/dL (ref 6.0–8.3)

## 2017-08-26 LAB — CBC WITH DIFFERENTIAL/PLATELET
Basophils Absolute: 0.1 10*3/uL (ref 0.0–0.1)
Basophils Relative: 0.9 % (ref 0.0–3.0)
EOS PCT: 4.3 % (ref 0.0–5.0)
Eosinophils Absolute: 0.2 10*3/uL (ref 0.0–0.7)
HCT: 41.4 % (ref 36.0–46.0)
Hemoglobin: 13.9 g/dL (ref 12.0–15.0)
LYMPHS ABS: 1.7 10*3/uL (ref 0.7–4.0)
Lymphocytes Relative: 29.5 % (ref 12.0–46.0)
MCHC: 33.6 g/dL (ref 30.0–36.0)
MCV: 94.1 fl (ref 78.0–100.0)
MONO ABS: 0.5 10*3/uL (ref 0.1–1.0)
MONOS PCT: 8 % (ref 3.0–12.0)
NEUTROS ABS: 3.3 10*3/uL (ref 1.4–7.7)
NEUTROS PCT: 57.3 % (ref 43.0–77.0)
Platelets: 279 10*3/uL (ref 150.0–400.0)
RBC: 4.4 Mil/uL (ref 3.87–5.11)
RDW: 11.9 % (ref 11.5–15.5)
WBC: 5.8 10*3/uL (ref 4.0–10.5)

## 2017-08-26 LAB — LIPID PANEL
CHOLESTEROL: 168 mg/dL (ref 0–200)
HDL: 53.8 mg/dL (ref >39.00)
LDL CALC: 100 mg/dL — AB (ref 0–99)
NONHDL: 114.24
Total CHOL/HDL Ratio: 3
Triglycerides: 69 mg/dL (ref 0.0–149.0)
VLDL: 13.8 mg/dL (ref 0.0–40.0)

## 2017-08-26 LAB — TSH: TSH: 1.53 u[IU]/mL (ref 0.35–4.50)

## 2017-08-26 LAB — HEMOGLOBIN A1C: HEMOGLOBIN A1C: 5.5 % (ref 4.6–6.5)

## 2017-08-26 NOTE — Progress Notes (Signed)
Patient ID: Deborah Mcpherson, female  DOB: 1975-12-15, 42 y.o.   MRN: [id number] Patient Care Team    Relationship Specialty Notifications Start End  Deborah Hillock, DO PCP - General Family Medicine  03/18/16   Deborah Millers, MD Consulting Physician Obstetrics and Gynecology  08/26/17   Deborah May, MD Consulting Physician Psychiatry  08/26/17    Comment: ADD, depression/anxiety    Chief Complaint  Patient presents with  . Annual Exam    Subjective:  Deborah Mcpherson is a 42 y.o.  Female  present for CPE. All past medical history, surgical history, allergies, family history, immunizations, medications and social history were obtained and updated in the electronic medical record today. All recent labs, ED visits and hospitalizations within the last year were reviewed.  Alcohol consumption: pt reports daily alcohol consumption of about 1 bottle of wine daily for a long time. She quit drinking Feb.2, 2019 and reports she is feeling good. She occasionally would like to have a glass of wine but refrains .Her husband has also quit drinking.   Left wrist mass: Has had for many years, does not bother her much. Sometimes thumb will tingle but not often.   Health maintenance:  Colonoscopy: No fhx, screen at 52 Mammogram: No fhc, screen at 42 (has GYN) Cervical cancer screening: last pap: 2018 (per pt), results: have been normal in the past. pt reports  completed by: Dr. Freda Mcpherson.  Immunizations: tdap UTD 12/2014, Influenza not a candidate- allergy. Infectious disease screening: HIV pt agreeable to testing today.  DEXA: N/A Assistive device: none Oxygen use: none Patient has a Dental home. Hospitalizations/ED visits: none  Depression screen [facility] 2/9 08/26/2017 08/26/2017  Decreased Interest 0 0  Down, Depressed, Hopeless 0 0  PHQ - 2 Score 0 0  Altered sleeping 0 -  Tired, decreased energy 0 -  Change in appetite 0 -  Feeling bad or failure about yourself  0 -  Trouble  concentrating 0 -  Moving slowly or fidgety/restless 0 -  Suicidal thoughts 0 -  PHQ-9 Score 0 -  Difficult doing work/chores Not difficult at all -   No flowsheet data found.   Current Exercise Habits: Home exercise routine, Time (Minutes): 45, Frequency (Times/Week): 4, Weekly Exercise (Minutes/Week): 180, Intensity: Moderate    Immunization History  Administered Date(s) Administered  . Tdap 01/07/2015     Past Medical History:  Diagnosis Date  . ADHD (attention deficit hyperactivity disorder)   . Anxiety   . Depression    Pristique and lexapro in past  . Migraine syndrome    stress is trigger  . Miscarriage 2001   Allergies  Allergen Reactions  . Eggs Or Egg-Derived Products Swelling    Tongue and lips swell  . Penicillins Hives   Past Surgical History:  Procedure Laterality Date  . DILATION AND CURETTAGE OF UTERUS     miscarriage  . OOPHORECTOMY Left    tube and ovary removed--all benign  . OVARIAN CYST REMOVAL     x 3   Family History  Problem Relation Age of Onset  . Lung cancer Father   . Alzheimer's disease Maternal Grandfather   . Alcohol abuse Paternal Grandfather   . Alcohol abuse Paternal Aunt   . Alcohol abuse Paternal Uncle    Social History   Socioeconomic History  . Marital status: Married    Spouse name: Not on file  . Number of children: Not on file  . Years  of education: Not on file  . Highest education level: Not on file  Social Needs  . Financial resource strain: Not on file  . Food insecurity - worry: Not on file  . Food insecurity - inability: Not on file  . Transportation needs - medical: Not on file  . Transportation needs - non-medical: Not on file  Occupational History  . Not on file  Tobacco Use  . Smoking status: Former Smoker    Types: Cigarettes    Last attempt to quit: 04/29/2009    Years since quitting: 8.3  . Smokeless tobacco: Never Used  Substance and Sexual Activity  . Alcohol use: Yes    Alcohol/week: 0.6  oz    Types: 1 Glasses of wine per week  . Drug use: No  . Sexual activity: Yes    Partners: Male  Other Topics Concern  . Not on file  Social History Narrative   Married, has 49 y/o daughter.   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   Orig from [place] area.   Tob: 5 pack-yr hx, quit 2010.   Alcohol: occ glass of wine.    No drugs.   Allergies as of 08/26/2017      Reactions   Eggs Or Egg-derived Products Swelling   Tongue and lips swell   Penicillins Hives      Medication List        Accurate as of 08/26/17 11:38 AM. Always use your most recent med list.          alprazolam 2 MG tablet Commonly known as:  XANAX Take 2 mg by mouth at bedtime as needed for sleep.   amphetamine-dextroamphetamine 30 MG tablet Commonly known as:  ADDERALL Take 30 mg by mouth 2 (two) times daily.   buPROPion 150 MG 12 hr tablet Commonly known as:  WELLBUTRIN SR Take 150 mg by mouth 2 (two) times daily.   JUNEL 1/20 1-20 MG-MCG tablet Generic drug:  norethindrone-ethinyl estradiol Take 1 tablet by mouth daily.       All past medical history, surgical history, allergies, family history, immunizations andmedications were updated in the EMR today and reviewed under the history and medication portions of their EMR.     No results found for this or any previous visit (from the past 2160 hour(s)).  No results found.  ROS: 14 pt review of systems performed and negative (unless mentioned in an HPI)  Objective: BP 125/76 (BP Location: Right Arm, Patient Position: Sitting, Cuff Size: Normal)   Pulse 80   Temp 98 F (36.7 C)   Resp 20   Ht '5\' 8"'  (1.727 m)   Wt 153 lb 12 oz (69.7 kg)   SpO2 98%   BMI 23.38 kg/m  Gen: Afebrile. No acute distress. Nontoxic in appearance, well-developed, well-nourished,  pleasant caucasian female.  HENT: AT. [place]. Bilateral TM visualized and normal in appearance, normal external auditory canal. MMM, no oral lesions, adequate dentition. Bilateral  nares within normal limits. Throat without erythema, ulcerations or exudates. no Cough on exam, no hoarseness on exam. Eyes:Pupils Equal Round Reactive to light, Extraocular movements intact,  Conjunctiva without redness, discharge or icterus. Neck/lymp/endocrine: Supple,no lymphadenopathy, no thyromegaly CV: RRR no murmur, no edema, +2/4 P posterior tibialis pulses. No  carotid bruits. No JVD. Chest: CTAB, no wheeze, rhonchi or crackles. normal Respiratory effort. good Air movement. Abd: Soft. flat. NTND. BS present. no Masses palpated. No hepatosplenomegaly. No rebound tenderness or guarding. Skin: no rashes, purpura or petechiae. Warm  and well-perfused. Skin intact. Neuro/Msk: Normal gait. PERLA. EOMi. Alert. Oriented x3.  Cranial nerves II through XII intact. Muscle strength 5/5 upper/lower extremity. DTRs equal bilaterally. Psych: Normal affect, dress and demeanor. Normal speech. Normal thought content and judgment.  No exam data present  Assessment/plan: Deborah Mcpherson is a 42 y.o. female present for CPE. Depression with anxiety Attention deficit hyperactivity disorder (ADHD), unspecified ADHD type - TSH - managed by Dr. Toy Care. Medications provided by Psych.  Encounter for screening for HIV - HIV antibody (with reflex) Diabetes mellitus screening - HgB A1c Screening cholesterol level - Lipid panel Encounter for long-term (current) use of medications - Comp Met (CMET) Screening for deficiency anemia - CBC w/Diff Encounter for preventive health examination Patient was encouraged to exercise greater than 150 minutes a week. Patient was encouraged to choose a diet filled with fresh fruits and vegetables, and lean meats. AVS provided to patient today for education/recommendation on gender specific health and safety maintenance. Colonoscopy: No fhx, screen at 52 Mammogram: No fhc, screen at 42 (has GYN) Cervical cancer screening: last pap: 03/2017 (per pt), results: have been normal  in the past. pt reports  completed by: Dr. Freda Mcpherson.  Immunizations: tdap UTD 12/2014, Influenza not a candidate- allergy. Infectious disease screening: HIV pt agreeable to testing today.   Return in about 1 year (around 08/26/2018) for CPE.  Electronically signed by: Howard Pouch, DO [place]

## 2017-08-26 NOTE — Patient Instructions (Addendum)
Start a Super-B complex vitamin. We will call you with all lab results.   Health Maintenance, Female Adopting a healthy lifestyle and getting preventive care can go a long way to promote health and wellness. Talk with your health care provider about what schedule of regular examinations is right for you. This is a good chance for you to check in with your provider about disease prevention and staying healthy. In between checkups, there are plenty of things you can do on your own. Experts have done a lot of research about which lifestyle changes and preventive measures are most likely to keep you healthy. Ask your health care provider for more information. Weight and diet Eat a healthy diet  Be sure to include plenty of vegetables, fruits, low-fat dairy products, and lean protein.  Do not eat a lot of foods high in solid fats, added sugars, or salt.  Get regular exercise. This is one of the most important things you can do for your health. ? Most adults should exercise for at least 150 minutes each week. The exercise should increase your heart rate and make you sweat (moderate-intensity exercise). ? Most adults should also do strengthening exercises at least twice a week. This is in addition to the moderate-intensity exercise.  Maintain a healthy weight  Body mass index (BMI) is a measurement that can be used to identify possible weight problems. It estimates body fat based on height and weight. Your health care provider can help determine your BMI and help you achieve or maintain a healthy weight.  For females 16 years of age and older: ? A BMI below 18.5 is considered underweight. ? A BMI of 18.5 to 24.9 is normal. ? A BMI of 25 to 29.9 is considered overweight. ? A BMI of 30 and above is considered obese.  Watch levels of cholesterol and blood lipids  You should start having your blood tested for lipids and cholesterol at 40 years of age, then have this test every 5 years.  You may  need to have your cholesterol levels checked more often if: ? Your lipid or cholesterol levels are high. ? You are older than 40 years of age. ? You are at high risk for heart disease.  Cancer screening Lung Cancer  Lung cancer screening is recommended for adults 60-10 years old who are at high risk for lung cancer because of a history of smoking.  A yearly low-dose CT scan of the lungs is recommended for people who: ? Currently smoke. ? Have quit within the past 15 years. ? Have at least a 30-pack-year history of smoking. A pack year is smoking an average of one pack of cigarettes a day for 1 year.  Yearly screening should continue until it has been 15 years since you quit.  Yearly screening should stop if you develop a health problem that would prevent you from having lung cancer treatment.  Breast Cancer  Practice breast self-awareness. This means understanding how your breasts normally appear and feel.  It also means doing regular breast self-exams. Let your health care provider know about any changes, no matter how small.  If you are in your 20s or 30s, you should have a clinical breast exam (CBE) by a health care provider every 1-3 years as part of a regular health exam.  If you are 38 or older, have a CBE every year. Also consider having a breast X-ray (mammogram) every year.  If you have a family history of breast cancer, talk  to your health care provider about genetic screening.  If you are at high risk for breast cancer, talk to your health care provider about having an MRI and a mammogram every year.  Breast cancer gene (BRCA) assessment is recommended for women who have family members with BRCA-related cancers. BRCA-related cancers include: ? Breast. ? Ovarian. ? Tubal. ? Peritoneal cancers.  Results of the assessment will determine the need for genetic counseling and BRCA1 and BRCA2 testing.  Cervical Cancer Your health care provider may recommend that you be  screened regularly for cancer of the pelvic organs (ovaries, uterus, and vagina). This screening involves a pelvic examination, including checking for microscopic changes to the surface of your cervix (Pap test). You may be encouraged to have this screening done every 3 years, beginning at age 42.  For women ages 66-65, health care providers may recommend pelvic exams and Pap testing every 3 years, or they may recommend the Pap and pelvic exam, combined with testing for human papilloma virus (HPV), every 5 years. Some types of HPV increase your risk of cervical cancer. Testing for HPV may also be done on women of any age with unclear Pap test results.  Other health care providers may not recommend any screening for nonpregnant women who are considered low risk for pelvic cancer and who do not have symptoms. Ask your health care provider if a screening pelvic exam is right for you.  If you have had past treatment for cervical cancer or a condition that could lead to cancer, you need Pap tests and screening for cancer for at least 20 years after your treatment. If Pap tests have been discontinued, your risk factors (such as having a new sexual partner) need to be reassessed to determine if screening should resume. Some women have medical problems that increase the chance of getting cervical cancer. In these cases, your health care provider may recommend more frequent screening and Pap tests.  Colorectal Cancer  This type of cancer can be detected and often prevented.  Routine colorectal cancer screening usually begins at 40 years of age and continues through 40 years of age.  Your health care provider may recommend screening at an earlier age if you have risk factors for colon cancer.  Your health care provider may also recommend using home test kits to check for hidden blood in the stool.  A small camera at the end of a tube can be used to examine your colon directly (sigmoidoscopy or colonoscopy).  This is done to check for the earliest forms of colorectal cancer.  Routine screening usually begins at age 44.  Direct examination of the colon should be repeated every 5-10 years through 40 years of age. However, you may need to be screened more often if early forms of precancerous polyps or small growths are found.  Skin Cancer  Check your skin from head to toe regularly.  Tell your health care provider about any new moles or changes in moles, especially if there is a change in a mole's shape or color.  Also tell your health care provider if you have a mole that is larger than the size of a pencil eraser.  Always use sunscreen. Apply sunscreen liberally and repeatedly throughout the day.  Protect yourself by wearing long sleeves, pants, a wide-brimmed hat, and sunglasses whenever you are outside.  Heart disease, diabetes, and high blood pressure  High blood pressure causes heart disease and increases the risk of stroke. High blood pressure is more  likely to develop in: ? People who have blood pressure in the high end of the normal range (130-139/85-89 mm Hg). ? People who are overweight or obese. ? People who are African American.  If you are 36-78 years of age, have your blood pressure checked every 3-5 years. If you are 46 years of age or older, have your blood pressure checked every year. You should have your blood pressure measured twice-once when you are at a hospital or clinic, and once when you are not at a hospital or clinic. Record the average of the two measurements. To check your blood pressure when you are not at a hospital or clinic, you can use: ? An automated blood pressure machine at a pharmacy. ? A home blood pressure monitor.  If you are between 20 years and 57 years old, ask your health care provider if you should take aspirin to prevent strokes.  Have regular diabetes screenings. This involves taking a blood sample to check your fasting blood sugar level. ? If  you are at a normal weight and have a low risk for diabetes, have this test once every three years after 40 years of age. ? If you are overweight and have a high risk for diabetes, consider being tested at a younger age or more often. Preventing infection Hepatitis B  If you have a higher risk for hepatitis B, you should be screened for this virus. You are considered at high risk for hepatitis B if: ? You were born in a country where hepatitis B is common. Ask your health care provider which countries are considered high risk. ? Your parents were born in a high-risk country, and you have not been immunized against hepatitis B (hepatitis B vaccine). ? You have HIV or AIDS. ? You use needles to inject street drugs. ? You live with someone who has hepatitis B. ? You have had sex with someone who has hepatitis B. ? You get hemodialysis treatment. ? You take certain medicines for conditions, including cancer, organ transplantation, and autoimmune conditions.  Hepatitis C  Blood testing is recommended for: ? Everyone born from [birth year] through 1963. ? Anyone with known risk factors for hepatitis C.  Sexually transmitted infections (STIs)  You should be screened for sexually transmitted infections (STIs) including gonorrhea and chlamydia if: ? You are sexually active and are younger than 40 years of age. ? You are older than 40 years of age and your health care provider tells you that you are at risk for this type of infection. ? Your sexual activity has changed since you were last screened and you are at an increased risk for chlamydia or gonorrhea. Ask your health care provider if you are at risk.  If you do not have HIV, but are at risk, it may be recommended that you take a prescription medicine daily to prevent HIV infection. This is called pre-exposure prophylaxis (PrEP). You are considered at risk if: ? You are sexually active and do not regularly use condoms or know the HIV status of your  partner(s). ? You take drugs by injection. ? You are sexually active with a partner who has HIV.  Talk with your health care provider about whether you are at high risk of being infected with HIV. If you choose to begin PrEP, you should first be tested for HIV. You should then be tested every 3 months for as long as you are taking PrEP. Pregnancy  If you are premenopausal and you may  become pregnant, ask your health care provider about preconception counseling.  If you may become pregnant, take 400 to 800 micrograms (mcg) of folic acid every day.  If you want to prevent pregnancy, talk to your health care provider about birth control (contraception). Osteoporosis and menopause  Osteoporosis is a disease in which the bones lose minerals and strength with aging. This can result in serious bone fractures. Your risk for osteoporosis can be identified using a bone density scan.  If you are 31 years of age or older, or if you are at risk for osteoporosis and fractures, ask your health care provider if you should be screened.  Ask your health care provider whether you should take a calcium or vitamin D supplement to lower your risk for osteoporosis.  Menopause may have certain physical symptoms and risks.  Hormone replacement therapy may reduce some of these symptoms and risks. Talk to your health care provider about whether hormone replacement therapy is right for you. Follow these instructions at home:  Schedule regular health, dental, and eye exams.  Stay current with your immunizations.  Do not use any tobacco products including cigarettes, chewing tobacco, or electronic cigarettes.  If you are pregnant, do not drink alcohol.  If you are breastfeeding, limit how much and how often you drink alcohol.  Limit alcohol intake to no more than 1 drink per day for nonpregnant women. One drink equals 12 ounces of beer, 5 ounces of wine, or 1 ounces of hard liquor.  Do not use street  drugs.  Do not share needles.  Ask your health care provider for help if you need support or information about quitting drugs.  Tell your health care provider if you often feel depressed.  Tell your health care provider if you have ever been abused or do not feel safe at home. This information is not intended to replace advice given to you by your health care provider. Make sure you discuss any questions you have with your health care provider. Document Released: 01/11/2011 Document Revised: 12/04/2015 Document Reviewed: 04/01/2015 Elsevier Interactive Patient Education  2018 [employer].  Please help [place] help you:  We are honored you have chosen [facility] for your Primary Care home. Below you will find basic instructions that you may need to access in the future. Please help [place] help you by reading the instructions, which cover many of the frequent questions we experience.   Prescription refills and request:  -In order to allow more efficient response time, please call your pharmacy for all refills. They will forward the request electronically to [place]. This allows for the quickest possible response. Request left on a nurse line can take longer to refill, since these are checked as time allows between office patients and other phone calls.  - refill request can take up to 3-5 working days to complete.  - If request is sent electronically and request is appropiate, it is usually completed in 1-2 business days.  - all patients will need to be seen routinely for all chronic medical conditions requiring prescription medications (see follow-up below). If you are overdue for follow up on your condition, you will be asked to make an appointment and we will call in enough medication to cover you until your appointment (up to 30 days).  - all controlled substances will require a face to face visit to request/refill.  - if you desire your prescriptions to go through a new pharmacy, and have an  active script at  original pharmacy, you will need to call your pharmacy and have scripts transferred to new pharmacy. This is completed between the pharmacy locations and not by your provider.    Results: If any images or labs were ordered, it can take up to 1 week to get results depending on the test ordered and the lab/facility running and resulting the test. - Normal or stable results, which do not need further discussion, may be released to your mychart immediately with attached note to you. A call may not be generated for normal results. Please make certain to sign up for mychart. If you have questions on how to activate your mychart you can call the front office.  - If your results need further discussion, our office will attempt to contact you via phone, and if unable to reach you after 2 attempts, we will release your abnormal result to your mychart with instructions.  - All results will be automatically released in mychart after 1 week.  - Your provider will provide you with explanation and instruction on all relevant material in your results. Please keep in mind, results and labs may appear confusing or abnormal to the untrained eye, but it does not mean they are actually abnormal for you personally. If you have any questions about your results that are not covered, or you desire more detailed explanation than what was provided, you should make an appointment with your provider to do so.   Our office handles many outgoing and incoming calls daily. If we have not contacted you within 1 week about your results, please check your mychart to see if there is a message first and if not, then contact our office.  In helping with this matter, you help decrease call volume, and therefore allow [place] to be able to respond to patients needs more efficiently.   Acute office visits (sick visit):  An acute visit is intended for a new problem and are scheduled in shorter time slots to allow schedule openings for  patients with new problems. This is the appropriate visit to discuss a new problem. In order to provide you with excellent quality medical care with proper time for you to explain your problem, have an exam and receive treatment with instructions, these appointments should be limited to one new problem per visit. If you experience a new problem, in which you desire to be addressed, please make an acute office visit, we save openings on the schedule to accommodate you. Please do not save your new problem for any other type of visit, let us take care of it properly and quickly for you.   Follow up visits:  Depending on your condition(s) your provider will need to see you routinely in order to provide you with quality care and prescribe medication(s). Most chronic conditions (Example: hypertension, Diabetes, depression/anxiety... etc), require visits a couple times a year. Your provider will instruct you on proper follow up for your personal medical conditions and history. Please make certain to make follow up appointments for your condition as instructed. Failing to do so could result in lapse in your medication treatment/refills. If you request a refill, and are overdue to be seen on a condition, we will always provide you with a 30 day script (once) to allow you time to schedule.    Medicare wellness (well visit): - we have a wonderful Nurse Maudie Mercury), that will meet with you and provide you will yearly medicare wellness visits. These visits should occur yearly (can not be scheduled  less than 1 calendar year apart) and cover preventive health, immunizations, advance directives and screenings you are entitled to yearly through your medicare benefits. Do not miss out on your entitled benefits, this is when medicare will pay for these benefits to be ordered for you.  These are strongly encouraged by your provider and is the appropriate type of visit to make certain you are up to date with all preventive health  benefits. If you have not had your medicare wellness exam in the last 12 months, please make certain to schedule one by calling the office and schedule your medicare wellness with Maudie Mercury as soon as possible.   Yearly physical (well visit):  - Adults are recommended to be seen yearly for physicals. Check with your insurance and date of your last physical, most insurances require one calendar year between physicals. Physicals include all preventive health topics, screenings, medical exam and labs that are appropriate for gender/age and history. You may have fasting labs needed at this visit. This is a well visit (not a sick visit), new problems should not be covered during this visit (see acute visit).  - Pediatric patients are seen more frequently when they are younger. Your provider will advise you on well child visit timing that is appropriate for your their age. - This is not a medicare wellness visit. Medicare wellness exams do not have an exam portion to the visit. Some medicare companies allow for a physical, some do not allow a yearly physical. If your medicare allows a yearly physical you can schedule the medicare wellness with our nurse Maudie Mercury and have your physical with your provider after, on the same day. Please check with insurance for your full benefits.   Late Policy/No Shows:  - all new patients should arrive 15-30 minutes earlier than appointment to allow [place] time  to  obtain all personal demographics,  insurance information and for you to complete office paperwork. - All established patients should arrive 10-15 minutes earlier than appointment time to update all information and be checked in .  - In our best efforts to run on time, if you are late for your appointment you will be asked to either reschedule or if able, we will work you back into the schedule. There will be a wait time to work you back in the schedule,  depending on availability.  - If you are unable to make it to your appointment  as scheduled, please call 24 hours ahead of time to allow [place] to fill the time slot with someone else who needs to be seen. If you do not cancel your appointment ahead of time, you may be charged a no show fee.

## 2017-08-27 LAB — HIV ANTIBODY (ROUTINE TESTING W REFLEX): HIV: NONREACTIVE

## 2017-10-21 DIAGNOSIS — F3342 Major depressive disorder, recurrent, in full remission: Secondary | ICD-10-CM | POA: Diagnosis not present

## 2017-10-21 DIAGNOSIS — F900 Attention-deficit hyperactivity disorder, predominantly inattentive type: Secondary | ICD-10-CM | POA: Diagnosis not present

## 2018-04-14 DIAGNOSIS — F410 Panic disorder [episodic paroxysmal anxiety] without agoraphobia: Secondary | ICD-10-CM | POA: Diagnosis not present

## 2018-04-14 DIAGNOSIS — F3342 Major depressive disorder, recurrent, in full remission: Secondary | ICD-10-CM | POA: Diagnosis not present

## 2018-04-14 DIAGNOSIS — F900 Attention-deficit hyperactivity disorder, predominantly inattentive type: Secondary | ICD-10-CM | POA: Diagnosis not present

## 2018-04-25 DIAGNOSIS — Z124 Encounter for screening for malignant neoplasm of cervix: Secondary | ICD-10-CM | POA: Diagnosis not present

## 2018-04-25 DIAGNOSIS — Z01419 Encounter for gynecological examination (general) (routine) without abnormal findings: Secondary | ICD-10-CM | POA: Diagnosis not present

## 2018-04-25 DIAGNOSIS — Z6821 Body mass index (BMI) 21.0-21.9, adult: Secondary | ICD-10-CM | POA: Diagnosis not present

## 2018-04-25 DIAGNOSIS — Z1231 Encounter for screening mammogram for malignant neoplasm of breast: Secondary | ICD-10-CM | POA: Diagnosis not present

## 2018-04-25 LAB — HM MAMMOGRAPHY

## 2018-08-23 DIAGNOSIS — J029 Acute pharyngitis, unspecified: Secondary | ICD-10-CM | POA: Diagnosis not present

## 2018-09-01 ENCOUNTER — Encounter: Payer: BLUE CROSS/BLUE SHIELD | Admitting: Family Medicine

## 2018-09-12 ENCOUNTER — Encounter: Payer: Self-pay | Admitting: Family Medicine

## 2018-09-12 ENCOUNTER — Ambulatory Visit (INDEPENDENT_AMBULATORY_CARE_PROVIDER_SITE_OTHER): Payer: BLUE CROSS/BLUE SHIELD | Admitting: Family Medicine

## 2018-09-12 VITALS — BP 104/68 | HR 76 | Temp 98.0°F | Resp 16 | Ht 68.0 in | Wt 153.4 lb

## 2018-09-12 DIAGNOSIS — F418 Other specified anxiety disorders: Secondary | ICD-10-CM

## 2018-09-12 DIAGNOSIS — Z131 Encounter for screening for diabetes mellitus: Secondary | ICD-10-CM | POA: Diagnosis not present

## 2018-09-12 DIAGNOSIS — Z0000 Encounter for general adult medical examination without abnormal findings: Secondary | ICD-10-CM

## 2018-09-12 DIAGNOSIS — Z130 Encounter for screening for diseases of the blood and blood-forming organs and certain disorders involving the immune mechanism: Secondary | ICD-10-CM | POA: Diagnosis not present

## 2018-09-12 DIAGNOSIS — Z79899 Other long term (current) drug therapy: Secondary | ICD-10-CM

## 2018-09-12 DIAGNOSIS — Z13220 Encounter for screening for lipoid disorders: Secondary | ICD-10-CM | POA: Diagnosis not present

## 2018-09-12 NOTE — Progress Notes (Signed)
Patient ID: Deborah Mcpherson, female  DOB: Jul 18, 1975, 43 y.o.   MRN: [id number] Patient Care Team    Relationship Specialty Notifications Start End  Ma Hillock, DO PCP - General Family Medicine  03/18/16   Olga Millers, MD Consulting Physician Obstetrics and Gynecology  08/26/17   Chucky May, MD Consulting Physician Psychiatry  08/26/17    Comment: ADD, depression/anxiety    Chief Complaint  Patient presents with  . Annual Exam    Not fasting. No complaints     Subjective:  Deborah Mcpherson is a 43 y.o.  Female  present for CPE . All past medical history, surgical history, allergies, family history, immunizations, medications and social history were updated in the electronic medical record today. All recent labs, ED visits and hospitalizations within the last year were reviewed.   Health maintenance: updated 09/12/18  Colonoscopy: No fhx, screen at 52 Mammogram: reported completed at gyn--> records requested.  Cervical cancer screening: last pap: 2019 (per pt), results: have been normal in the past. pt reports  completed by: Dr. Freda Munro.  Immunizations: tdap UTD 12/2014, Influenza not a candidate- allergy. Infectious disease screening: HIV completed DEXA: N/A Assistive device: none Assistive device: none Oxygen HFW:YOVZ Patient has a Dental home. Hospitalizations/ED visits: reviewed  Depression screen [facility] 2/9 09/12/2018 08/26/2017 08/26/2017  Decreased Interest 0 0 0  Down, Depressed, Hopeless 0 0 0  PHQ - 2 Score 0 0 0  Altered sleeping 0 0 -  Tired, decreased energy 0 0 -  Change in appetite 0 0 -  Feeling bad or failure about yourself  0 0 -  Trouble concentrating 1 0 -  Moving slowly or fidgety/restless 0 0 -  Suicidal thoughts 0 0 -  PHQ-9 Score 1 0 -  Difficult doing work/chores Not difficult at all Not difficult at all -   GAD 7 : Generalized Anxiety Score 09/12/2018  Nervous, Anxious, on Edge 0  Control/stop worrying 0  Worry too much -  different things 0  Trouble relaxing 0  Restless 0  Easily annoyed or irritable 0  Afraid - awful might happen 1  Total GAD 7 Score 1  Anxiety Difficulty Not difficult at all     Immunization History  Administered Date(s) Administered  . Tdap 01/07/2015     Past Medical History:  Diagnosis Date  . ADHD (attention deficit hyperactivity disorder)   . Anxiety   . Depression    Pristique and lexapro in past  . Migraine syndrome    stress is trigger  . Miscarriage 2001   Allergies  Allergen Reactions  . Eggs Or Egg-Derived Products Swelling    Tongue and lips swell  . Penicillins Hives and Anaphylaxis   Past Surgical History:  Procedure Laterality Date  . DILATION AND CURETTAGE OF UTERUS     miscarriage  . OOPHORECTOMY Left    tube and ovary removed--all benign  . OVARIAN CYST REMOVAL     x 3   Family History  Problem Relation Age of Onset  . Lung cancer Father   . Alzheimer's disease Maternal Grandfather   . Alcohol abuse Paternal Grandfather   . Alcohol abuse Paternal Aunt   . Alcohol abuse Paternal Uncle    Social History   Social History Narrative   Married, has 38 y/o daughter.   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   Orig from [place] area.   Tob: 5 pack-yr hx, quit 2010.   Alcohol: occ glass  of wine.    No drugs.    Allergies as of 09/12/2018      Reactions   Eggs Or Egg-derived Products Swelling   Tongue and lips swell   Penicillins Hives, Anaphylaxis      Medication List       Accurate as of September 12, 2018  1:16 PM. Always use your most recent med list.        alprazolam 2 MG tablet Commonly known as:  XANAX Take 2 mg by mouth at bedtime as needed for sleep.   amphetamine-dextroamphetamine 30 MG tablet Commonly known as:  ADDERALL Take 30 mg by mouth 2 (two) times daily.   b complex vitamins tablet Take 1 tablet by mouth daily.   buPROPion 150 MG 12 hr tablet Commonly known as:  WELLBUTRIN SR Take 150 mg by mouth daily.    JUNEL 1/20 1-20 MG-MCG tablet Generic drug:  norethindrone-ethinyl estradiol Take 1 tablet by mouth daily.       All past medical history, surgical history, allergies, family history, immunizations andmedications were updated in the EMR today and reviewed under the history and medication portions of their EMR.     No results found for this or any previous visit (from the past 2160 hour(s)).  ROS: 14 pt review of systems performed and negative (unless mentioned in an HPI)  Objective: BP 104/68 (BP Location: Left Arm, Patient Position: Sitting, Cuff Size: Normal)   Pulse 76   Temp 98 F (36.7 C) (Oral)   Resp 16   Ht '5\' 8"'  (1.727 m)   Wt 153 lb 6 oz (69.6 kg)   SpO2 98%   BMI 23.32 kg/m  Gen: Afebrile. No acute distress. Nontoxic in appearance, well-developed, well-nourished,  Pleasant caucasian female.  HENT: AT. . Bilateral TM visualized and normal in appearance, normal external auditory canal. MMM, no oral lesions, adequate dentition. Bilateral nares within normal limits. Throat without erythema, ulcerations or exudates. no Cough on exam, no hoarseness on exam. Eyes:Pupils Equal Round Reactive to light, Extraocular movements intact,  Conjunctiva without redness, discharge or icterus. Neck/lymp/endocrine: Supple,no lymphadenopathy, no thyromegaly CV: RRR no murmur, no edema, +2/4 P posterior tibialis pulses. no carotid bruits. No JVD. Chest: CTAB, no wheeze, rhonchi or crackles. normal Respiratory effort. good Air movement. Abd: Soft. flat. NTND. BS present. no Masses palpated. No hepatosplenomegaly. No rebound tenderness or guarding. Skin: no rashes, purpura or petechiae. Warm and well-perfused. Skin intact. Neuro/Msk: Normal gait. PERLA. EOMi. Alert. Oriented x3.  Cranial nerves II through XII intact. Muscle strength 5/5 upper/lower extremity. DTRs equal bilaterally. Psych: Normal affect, dress and demeanor. Normal speech. Normal thought content and judgment. No adnexal  fullness. Anus and perineum within normal limits, no lesions.  No exam data present  Assessment/plan: Deborah Mcpherson is a 43 y.o. female present for CPE. Depression with anxiety Managed by psychiatry - TSH Diabetes mellitus screening - HgB A1c Screening cholesterol level - Lipid panel Encounter for long-term current use of medication - Comp Met (CMET) Screening for deficiency anemia - CBC w/Diff Encounter for preventive health examination Patient was encouraged to exercise greater than 150 minutes a week. Patient was encouraged to choose a diet filled with fresh fruits and vegetables, and lean meats. AVS provided to patient today for education/recommendation on gender specific health and safety maintenance. Colonoscopy: No fhx, screen at 52 Mammogram: reported completed at gyn--> records requested.  Cervical cancer screening: last pap: 2019 (per pt), results: have been normal in the past. pt reports  completed by: Dr. Freda Munro.  Immunizations: tdap UTD 12/2014, Influenza not a candidate- allergy. Infectious disease screening: HIV completed DEXA: N/A  Return in about 1 year (around 09/12/2019) for CPE.  Electronically signed by: Howard Pouch, DO [place]

## 2018-09-12 NOTE — Patient Instructions (Signed)

## 2018-09-13 LAB — COMPREHENSIVE METABOLIC PANEL
AG RATIO: 1.9 (calc) (ref 1.0–2.5)
ALBUMIN MSPROF: 3.9 g/dL (ref 3.6–5.1)
ALT: 10 U/L (ref 6–29)
AST: 14 U/L (ref 10–30)
Alkaline phosphatase (APISO): 54 U/L (ref 31–125)
BUN: 15 mg/dL (ref 7–25)
CHLORIDE: 106 mmol/L (ref 98–110)
CO2: 27 mmol/L (ref 20–32)
CREATININE: 0.86 mg/dL (ref 0.50–1.10)
Calcium: 9.2 mg/dL (ref 8.6–10.2)
GLOBULIN: 2.1 g/dL (ref 1.9–3.7)
GLUCOSE: 114 mg/dL — AB (ref 65–99)
POTASSIUM: 4.1 mmol/L (ref 3.5–5.3)
SODIUM: 141 mmol/L (ref 135–146)
TOTAL PROTEIN: 6 g/dL — AB (ref 6.1–8.1)
Total Bilirubin: 0.3 mg/dL (ref 0.2–1.2)

## 2018-09-13 LAB — CBC WITH DIFFERENTIAL/PLATELET
Absolute Monocytes: 572 cells/uL (ref 200–950)
BASOS PCT: 0.9 %
Basophils Absolute: 59 cells/uL (ref 0–200)
EOS ABS: 514 {cells}/uL — AB (ref 15–500)
Eosinophils Relative: 7.9 %
HCT: 39 % (ref 35.0–45.0)
HEMOGLOBIN: 13.3 g/dL (ref 11.7–15.5)
Lymphs Abs: 2041 cells/uL (ref 850–3900)
MCH: 31.7 pg (ref 27.0–33.0)
MCHC: 34.1 g/dL (ref 32.0–36.0)
MCV: 92.9 fL (ref 80.0–100.0)
MONOS PCT: 8.8 %
MPV: 10.7 fL (ref 7.5–12.5)
Neutro Abs: 3315 cells/uL (ref 1500–7800)
Neutrophils Relative %: 51 %
PLATELETS: 287 10*3/uL (ref 140–400)
RBC: 4.2 10*6/uL (ref 3.80–5.10)
RDW: 11 % (ref 11.0–15.0)
Total Lymphocyte: 31.4 %
WBC: 6.5 10*3/uL (ref 3.8–10.8)

## 2018-09-13 LAB — LIPID PANEL
Cholesterol: 173 mg/dL (ref <200)
HDL: 63 mg/dL (ref >=50)
LDL Cholesterol (Calc): 85 mg/dL (calc)
Non-HDL Cholesterol (Calc): 110 mg/dL (calc) (ref <130)
Total CHOL/HDL Ratio: 2.7 (calc) (ref <5.0)
Triglycerides: 150 mg/dL — ABNORMAL HIGH (ref <150)

## 2018-09-13 LAB — TSH: TSH: 0.91 mIU/L

## 2018-09-13 LAB — HEMOGLOBIN A1C
Hgb A1c MFr Bld: 5.5 % of total Hgb (ref <5.7)
MEAN PLASMA GLUCOSE: 111 (calc)
eAG (mmol/L): 6.2 (calc)

## 2018-09-14 ENCOUNTER — Encounter: Payer: Self-pay | Admitting: Family Medicine

## 2018-09-15 ENCOUNTER — Telehealth: Payer: Self-pay | Admitting: Family Medicine

## 2018-09-15 DIAGNOSIS — T781XXA Other adverse food reactions, not elsewhere classified, initial encounter: Secondary | ICD-10-CM | POA: Insufficient documentation

## 2018-09-15 MED ORDER — EPINEPHRINE 0.3 MG/0.3ML IJ SOAJ: 0.3000 mg | INTRAMUSCULAR | 0 refills | Status: DC | PRN

## 2018-09-15 NOTE — Telephone Encounter (Signed)
Please disregard. Pt responded back and has schedule an apt for 09/21/18.

## 2018-09-15 NOTE — Telephone Encounter (Signed)
Patient advised and voiced understanding. Agreeable to referral and aware she will be contacted for scheduling details.

## 2018-09-15 NOTE — Telephone Encounter (Signed)
Please advise. Thanks.  

## 2018-09-15 NOTE — Telephone Encounter (Signed)
Please inform patient the following information: Chart message suggest that she is having worsening reactions to possible egg product.  She has a known allergy to eggs. -I can certainly call in a EpiPen for her to use any emergent situations.  I do recommend she seek treatment emergently if this occurs again.  Called this in for her. -In addition we can refer her to a allergist for testing -she may be developing other allergies as well.  If she does agree to this, please place referral to allergy and asthma in the [facility] office.

## 2018-10-06 DIAGNOSIS — F900 Attention-deficit hyperactivity disorder, predominantly inattentive type: Secondary | ICD-10-CM | POA: Diagnosis not present

## 2018-10-06 DIAGNOSIS — F3342 Major depressive disorder, recurrent, in full remission: Secondary | ICD-10-CM | POA: Diagnosis not present

## 2018-10-31 ENCOUNTER — Ambulatory Visit: Payer: BLUE CROSS/BLUE SHIELD | Admitting: Allergy and Immunology

## 2019-04-09 DIAGNOSIS — F3342 Major depressive disorder, recurrent, in full remission: Secondary | ICD-10-CM | POA: Diagnosis not present

## 2019-04-09 DIAGNOSIS — F900 Attention-deficit hyperactivity disorder, predominantly inattentive type: Secondary | ICD-10-CM | POA: Diagnosis not present

## 2019-05-29 DIAGNOSIS — Z1231 Encounter for screening mammogram for malignant neoplasm of breast: Secondary | ICD-10-CM | POA: Diagnosis not present

## 2019-05-29 DIAGNOSIS — Z124 Encounter for screening for malignant neoplasm of cervix: Secondary | ICD-10-CM | POA: Diagnosis not present

## 2019-05-29 DIAGNOSIS — Z01419 Encounter for gynecological examination (general) (routine) without abnormal findings: Secondary | ICD-10-CM | POA: Diagnosis not present

## 2019-05-29 DIAGNOSIS — Z6822 Body mass index (BMI) 22.0-22.9, adult: Secondary | ICD-10-CM | POA: Diagnosis not present

## 2019-05-30 LAB — HM MAMMOGRAPHY

## 2019-09-21 ENCOUNTER — Other Ambulatory Visit: Payer: Self-pay

## 2019-09-21 ENCOUNTER — Ambulatory Visit (INDEPENDENT_AMBULATORY_CARE_PROVIDER_SITE_OTHER): Payer: BC Managed Care – PPO | Admitting: Family Medicine

## 2019-09-21 ENCOUNTER — Encounter: Payer: Self-pay | Admitting: Family Medicine

## 2019-09-21 VITALS — BP 110/77 | HR 81 | Temp 98.3°F | Ht 68.0 in | Wt 154.8 lb

## 2019-09-21 DIAGNOSIS — G44209 Tension-type headache, unspecified, not intractable: Secondary | ICD-10-CM | POA: Diagnosis not present

## 2019-09-21 DIAGNOSIS — Z0000 Encounter for general adult medical examination without abnormal findings: Secondary | ICD-10-CM | POA: Diagnosis not present

## 2019-09-21 DIAGNOSIS — Z131 Encounter for screening for diabetes mellitus: Secondary | ICD-10-CM | POA: Diagnosis not present

## 2019-09-21 DIAGNOSIS — Z79899 Other long term (current) drug therapy: Secondary | ICD-10-CM

## 2019-09-21 DIAGNOSIS — F418 Other specified anxiety disorders: Secondary | ICD-10-CM | POA: Diagnosis not present

## 2019-09-21 DIAGNOSIS — R200 Anesthesia of skin: Secondary | ICD-10-CM

## 2019-09-21 DIAGNOSIS — Z130 Encounter for screening for diseases of the blood and blood-forming organs and certain disorders involving the immune mechanism: Secondary | ICD-10-CM

## 2019-09-21 DIAGNOSIS — Z13220 Encounter for screening for lipoid disorders: Secondary | ICD-10-CM | POA: Diagnosis not present

## 2019-09-21 DIAGNOSIS — R202 Paresthesia of skin: Secondary | ICD-10-CM

## 2019-09-21 LAB — LIPID PANEL
Cholesterol: 165 mg/dL (ref 0–200)
HDL: 57.8 mg/dL (ref >39.00)
LDL Cholesterol: 91 mg/dL (ref 0–99)
NonHDL: 107.14
Total CHOL/HDL Ratio: 3
Triglycerides: 79 mg/dL (ref 0.0–149.0)
VLDL: 15.8 mg/dL (ref 0.0–40.0)

## 2019-09-21 LAB — COMPREHENSIVE METABOLIC PANEL
ALT: 11 U/L (ref 0–35)
AST: 12 U/L (ref 0–37)
Albumin: 3.9 g/dL (ref 3.5–5.2)
Alkaline Phosphatase: 64 U/L (ref 39–117)
BUN: 13 mg/dL (ref 6–23)
CO2: 27 mEq/L (ref 19–32)
Calcium: 9.3 mg/dL (ref 8.4–10.5)
Chloride: 106 mEq/L (ref 96–112)
Creatinine, Ser: 0.97 mg/dL (ref 0.40–1.20)
GFR: 63.16 mL/min (ref >60.00)
Glucose, Bld: 101 mg/dL — ABNORMAL HIGH (ref 70–99)
Potassium: 4.4 mEq/L (ref 3.5–5.1)
Sodium: 138 mEq/L (ref 135–145)
Total Bilirubin: 0.5 mg/dL (ref 0.2–1.2)
Total Protein: 6.2 g/dL (ref 6.0–8.3)

## 2019-09-21 LAB — CBC
HCT: 39.8 % (ref 36.0–46.0)
Hemoglobin: 13.2 g/dL (ref 12.0–15.0)
MCHC: 33.1 g/dL (ref 30.0–36.0)
MCV: 95.3 fl (ref 78.0–100.0)
Platelets: 273 10*3/uL (ref 150.0–400.0)
RBC: 4.18 Mil/uL (ref 3.87–5.11)
RDW: 12 % (ref 11.5–15.5)
WBC: 6.9 10*3/uL (ref 4.0–10.5)

## 2019-09-21 LAB — HEMOGLOBIN A1C: Hgb A1c MFr Bld: 5.5 % (ref 4.6–6.5)

## 2019-09-21 LAB — TSH: TSH: 0.97 u[IU]/mL (ref 0.35–4.50)

## 2019-09-21 NOTE — Progress Notes (Signed)
This visit occurred during the SARS-CoV-2 public health emergency.  Safety protocols were in place, including screening questions prior to the visit, additional usage of staff PPE, and extensive cleaning of exam room while observing appropriate contact time as indicated for disinfecting solutions.    Patient ID: Deborah Mcpherson, female  DOB: 1976/03/21, 44 y.o.   MRN: [id number] Patient Care Team    Relationship Specialty Notifications Start End  Ma Hillock, DO PCP - General Family Medicine  03/18/16   Olga Millers, MD Consulting Physician Obstetrics and Gynecology  08/26/17   Deborah May, MD Consulting Physician Psychiatry  08/26/17    Comment: ADD, depression/anxiety    Chief Complaint  Patient presents with  . Annual Exam    c/o arms going to sleep at night - numb when she wakes up at times.     Subjective: Deborah Mcpherson is a 44 y.o.  Female  present for CPE with acute complaints.  All past medical history, surgical history, allergies, family history, immunizations, medications and social history were udpated in the electronic medical record today. All recent labs, ED visits and hospitalizations within the last year were reviewed.  Numbness tingling BUE: Pt reports on occassions she has numbness and tingling in her upper extremities when she awakes. She reports with movement they will quickly regain normal feeling. It is usually one arm or the other, not both at same time. She endorses sleeping on her abdomen with her hands above her head usually. She states her husband is always commenting on her sleeping position.   Headaches: pt reports occasional frontal headache. She denies associated symptoms with her headache. She reports she never use to get headaches. She has had a couple over the last year that respond to NSAIDS. She did notice they started after she stopped running.   Health maintenance:  Colonoscopy: No fhx, screen at 52 Mammogram: reported completed at gyn  again 04/2019--> records requested.  Cervical cancer screening: last pap: 2019 (per pt), results: have been normal in the past. pt reports  completed by: Dr. Freda Mcpherson.  Immunizations: tdap UTD 12/2014, Influenza not a candidate- allergy. Covid vaccine counseled.  Infectious disease screening: HIV completed DEXA: N/A Assistive device: none Oxygen KGY:JEHU Patient has a Dental home. Hospitalizations/ED visits: reviewed  Depression screen [facility] 2/9 09/21/2019 09/12/2018 08/26/2017 08/26/2017  Decreased Interest 0 0 0 0  Down, Depressed, Hopeless 0 0 0 0  PHQ - 2 Score 0 0 0 0  Altered sleeping - 0 0 -  Tired, decreased energy - 0 0 -  Change in appetite - 0 0 -  Feeling bad or failure about yourself  - 0 0 -  Trouble concentrating - 1 0 -  Moving slowly or fidgety/restless - 0 0 -  Suicidal thoughts - 0 0 -  PHQ-9 Score - 1 0 -  Difficult doing work/chores - Not difficult at all Not difficult at all -   GAD 7 : Generalized Anxiety Score 09/12/2018  Nervous, Anxious, on Edge 0  Control/stop worrying 0  Worry too much - different things 0  Trouble relaxing 0  Restless 0  Easily annoyed or irritable 0  Afraid - awful might happen 1  Total GAD 7 Score 1  Anxiety Difficulty Not difficult at all    Immunization History  Administered Date(s) Administered  . Tdap 01/07/2015   Past Medical History:  Diagnosis Date  . ADHD (attention deficit hyperactivity disorder)   . Anxiety   .  Depression    Pristique and lexapro in past  . Migraine syndrome    stress is trigger  . Miscarriage 2001   Allergies  Allergen Reactions  . Eggs Or Egg-Derived Products Swelling    Tongue and lips swell  . Penicillins Hives and Anaphylaxis   Past Surgical History:  Procedure Laterality Date  . DILATION AND CURETTAGE OF UTERUS     miscarriage  . OOPHORECTOMY Left    tube and ovary removed--all benign  . OVARIAN CYST REMOVAL     x 3   Family History  Problem Relation Age of Onset  . Lung  cancer Father   . Alzheimer's disease Maternal Grandfather   . Alcohol abuse Paternal Grandfather   . Alcohol abuse Paternal Aunt   . Alcohol abuse Paternal Uncle    Social History   Social History Narrative   Married, has 69 y/o daughter.   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   Orig from [place] area.   Tob: 5 pack-yr hx, quit 2010.   Alcohol: occ glass of wine.    No drugs.    Allergies as of 09/21/2019      Reactions   Eggs Or Egg-derived Products Swelling   Tongue and lips swell   Penicillins Hives, Anaphylaxis      Medication List       Accurate as of September 21, 2019  8:37 AM. If you have any questions, ask your nurse or doctor.        alprazolam 2 MG tablet Commonly known as: XANAX Take 2 mg by mouth at bedtime as needed for sleep.   amphetamine-dextroamphetamine 30 MG tablet Commonly known as: ADDERALL Take 30 mg by mouth 2 (two) times daily.   b complex vitamins tablet Take 1 tablet by mouth daily.   buPROPion 150 MG 12 hr tablet Commonly known as: WELLBUTRIN SR Take 150 mg by mouth daily.   EPINEPHrine 0.3 mg/0.3 mL Soaj injection Commonly known as: EPI-PEN Inject 0.3 mLs (0.3 mg total) into the muscle as needed for anaphylaxis.   Junel 1/20 1-20 MG-MCG tablet Generic drug: norethindrone-ethinyl estradiol Take 1 tablet by mouth daily.       All past medical history, surgical history, allergies, family history, immunizations andmedications were updated in the EMR today and reviewed under the history and medication portions of their EMR.     No results found for this or any previous visit (from the past 2160 hour(s)).  No results found.   ROS: 14 pt review of systems performed and negative (unless mentioned in an HPI)  Objective: BP 110/77 (BP Location: Right Arm, Patient Position: Sitting, Cuff Size: Normal)   Pulse 81   Temp 98.3 F (36.8 C) (Temporal)   Ht _0  (1.727 m)   Wt 154 lb 12.8 oz (70.2 kg)   SpO2 98%   BMI 23.54  kg/m  Gen: Afebrile. No acute distress. Nontoxic in appearance, well-developed, well-nourished,  Pleasant caucasian female.  HENT: AT. [place]. Bilateral TM visualized and normal in appearance, normal external auditory canal. MMM, no oral lesions, adequate dentition. Bilateral nares within normal limits. Throat without erythema, ulcerations or exudates. no Cough on exam, no hoarseness on exam. Eyes:Pupils Equal Round Reactive to light, Extraocular movements intact,  Conjunctiva without redness, discharge or icterus. Neck/lymp/endocrine: Supple,no lymphadenopathy, no thyromegaly CV: RRR no murmur, noedema, +2/4 P posterior tibialis pulses. no carotid bruits. No JVD. Chest: CTAB, no wheeze, rhonchi or crackles. normal Respiratory effort. good Air movement. Abd: Soft. flat.  NTND. BS present. no Masses palpated. No hepatosplenomegaly. No rebound tenderness or guarding. Skin: no rashes, purpura or petechiae. Warm and well-perfused. Skin intact. Neuro/Msk:  Normal gait. PERLA. EOMi. Alert. Oriented x3.  Cranial nerves II through XII intact. Muscle strength 5/5 upper/lower extremity. DTRs equal bilaterally. Psych: Normal affect, dress and demeanor. Normal speech. Normal thought content and judgment.  No exam data present  Assessment/plan: CASSIOPEIA FLORENTINO is a 44 y.o. female present for CPE Depression with anxiety Managed by psychologist - TSH Diabetes mellitus screening - Hemoglobin A1c Screening cholesterol level - Lipid panel Screening for deficiency anemia CBC Encounter for long-term current use of medication - CBC - Comp Met (CMET) Numbness and tingling of both upper extremities Symptoms are caused by her sleeping with her arms over her head. Discussed stretches for neck and thoracic cage daily and sleeping with her hands down.   Tension headache Symptoms sound consistent with tension headache. encouraged her to restart running to help relieve anxiety and tension.  Monitor headaches and if  become more frequent, worsen or present with associated symptoms to return for further evaluation. Pt reported understanding.   Encounter for preventive health examination Patient was encouraged to exercise greater than 150 minutes a week. Patient was encouraged to choose a diet filled with fresh fruits and vegetables, and lean meats. AVS provided to patient today for education/recommendation on gender specific health and safety maintenance. Colonoscopy: No fhx, screen at 52 Mammogram: reported completed at gyn again 04/2019--> records requested.  Cervical cancer screening: last pap: 2019 (per pt), results: have been normal in the past. pt reports  completed by: Dr. Freda Mcpherson.  Immunizations: tdap UTD 12/2014, Influenza not a candidate- allergy. Covid vaccine counseled.  Infectious disease screening: HIV completed DEXA: N/A  Return in about 1 year (around 09/20/2020) for CPE (30 min).   Orders Placed This Encounter  Procedures  . CBC  . Comp Met (CMET)  . TSH  . Lipid panel  . Hemoglobin A1c   No orders of the defined types were placed in this encounter.  Referral Orders  No referral(s) requested today     Electronically signed by: Howard Pouch, [place]

## 2019-09-21 NOTE — Patient Instructions (Addendum)
COVID-19 Vaccine Information can be found at: [email or web address] For questions related to vaccine distribution or appointments, please email [email or web address] or call [phone].  Covid Vaccine appointment go to [email or web address]   Stretches for your neck and thoracic cage recommended. Try to sleep with hands down (not over your head).  If headaches worsen, have associated symptoms as we discussed or become more frequent then please be seen for further evaluation. Otherwise, these sound consistent with tension headaches.      Health Maintenance, Female Adopting a healthy lifestyle and getting preventive care are important in promoting health and wellness. Ask your health care provider about:  The right schedule for you to have regular tests and exams.  Things you can do on your own to prevent diseases and keep yourself healthy. What should I know about diet, weight, and exercise? Eat a healthy diet   Eat a diet that includes plenty of vegetables, fruits, low-fat dairy products, and lean protein.  Do not eat a lot of foods that are high in solid fats, added sugars, or sodium. Maintain a healthy weight Body mass index (BMI) is used to identify weight problems. It estimates body fat based on height and weight. Your health care provider can help determine your BMI and help you achieve or maintain a healthy weight. Get regular exercise Get regular exercise. This is one of the most important things you can do for your health. Most adults should:  Exercise for at least 150 minutes each week. The exercise should increase your heart rate and make you sweat (moderate-intensity exercise).  Do strengthening exercises at least twice a week. This is in addition to the moderate-intensity exercise.  Spend less time sitting. Even light physical activity can be beneficial. Watch cholesterol and blood lipids Have your blood  tested for lipids and cholesterol at 42 years of age, then have this test every 5 years. Have your cholesterol levels checked more often if:  Your lipid or cholesterol levels are high.  You are older than 42 years of age.  You are at high risk for heart disease. What should I know about cancer screening? Depending on your health history and family history, you may need to have cancer screening at various ages. This may include screening for:  Breast cancer.  Cervical cancer.  Colorectal cancer.  Skin cancer.  Lung cancer. What should I know about heart disease, diabetes, and high blood pressure? Blood pressure and heart disease  High blood pressure causes heart disease and increases the risk of stroke. This is more likely to develop in people who have high blood pressure readings, are of African descent, or are overweight.  Have your blood pressure checked: ? Every 3-5 years if you are 52-63 years of age. ? Every year if you are 64 years old or older. Diabetes Have regular diabetes screenings. This checks your fasting blood sugar level. Have the screening done:  Once every three years after age 30 if you are at a normal weight and have a low risk for diabetes.  More often and at a younger age if you are overweight or have a high risk for diabetes. What should I know about preventing infection? Hepatitis B If you have a higher risk for hepatitis B, you should be screened for this virus. Talk with your health care provider to find out if you are at risk for hepatitis B infection. Hepatitis C Testing is recommended for:  Everyone born from [birth year] through 1963.  Anyone with known risk factors for  hepatitis C. Sexually transmitted infections (STIs)  Get screened for STIs, including gonorrhea and chlamydia, if: ? You are sexually active and are younger than 42 years of age. ? You are older than 42 years of age and your health care provider tells you that you are at risk for  this type of infection. ? Your sexual activity has changed since you were last screened, and you are at increased risk for chlamydia or gonorrhea. Ask your health care provider if you are at risk.  Ask your health care provider about whether you are at high risk for HIV. Your health care provider may recommend a prescription medicine to help prevent HIV infection. If you choose to take medicine to prevent HIV, you should first get tested for HIV. You should then be tested every 3 months for as long as you are taking the medicine. Pregnancy  If you are about to stop having your period (premenopausal) and you may become pregnant, seek counseling before you get pregnant.  Take 400 to 800 micrograms (mcg) of folic acid every day if you become pregnant.  Ask for birth control (contraception) if you want to prevent pregnancy. Osteoporosis and menopause Osteoporosis is a disease in which the bones lose minerals and strength with aging. This can result in bone fractures. If you are 91 years old or older, or if you are at risk for osteoporosis and fractures, ask your health care provider if you should:  Be screened for bone loss.  Take a calcium or vitamin D supplement to lower your risk of fractures.  Be given hormone replacement therapy (HRT) to treat symptoms of menopause. Follow these instructions at home: Lifestyle  Do not use any products that contain nicotine or tobacco, such as cigarettes, e-cigarettes, and chewing tobacco. If you need help quitting, ask your health care provider.  Do not use street drugs.  Do not share needles.  Ask your health care provider for help if you need support or information about quitting drugs. Alcohol use  Do not drink alcohol if: ? Your health care provider tells you not to drink. ? You are pregnant, may be pregnant, or are planning to become pregnant.  If you drink alcohol: ? Limit how much you use to 0-1 drink a day. ? Limit intake if you are  breastfeeding.  Be aware of how much alcohol is in your drink. In the [place] one drink equals one 12 oz bottle of beer (355 mL), one 5 oz glass of wine (148 mL), or one 1 oz glass of hard liquor (44 mL). General instructions  Schedule regular health, dental, and eye exams.  Stay current with your vaccines.  Tell your health care provider if: ? You often feel depressed. ? You have ever been abused or do not feel safe at home. Summary  Adopting a healthy lifestyle and getting preventive care are important in promoting health and wellness.  Follow your health care provider's instructions about healthy diet, exercising, and getting tested or screened for diseases.  Follow your health care provider's instructions on monitoring your cholesterol and blood pressure. This information is not intended to replace advice given to you by your health care provider. Make sure you discuss any questions you have with your health care provider. Document Revised: 06/21/2018 Document Reviewed: 06/21/2018 Elsevier Patient Education  2020 [employer] Inc.   Tension Headache, Adult A tension headache is pain, pressure, or aching in your head. Tension headaches can last from 30 minutes to several days. Follow  these instructions at home: Managing pain  Take over-the-counter and prescription medicines only as told by your doctor.  When you have a headache, lie down in a dark, quiet room.  If told, put ice on your head and neck: ? Put ice in a plastic bag. ? Place a towel between your skin and the bag. ? Leave the ice on for 20 minutes, 2-3 times a day.  If told, put heat on the back of your neck. Do this as often as your doctor tells you to. Use the kind of heat that your doctor recommends, such as a moist heat pack or a heating pad. ? Place a towel between your skin and the heat. ? Leave the heat on for 20-30 minutes. ? Remove the heat if your skin turns bright red. Eating and drinking  Eat meals on  a regular schedule.  Watch how much alcohol you drink: ? If you are a woman and are not pregnant, do not drink more than 1 drink a day. ? If you are a man, do not drink more than 2 drinks a day.  Drink enough fluid to keep your pee (urine) pale yellow.  Do not use a lot of caffeine, or stop using caffeine. Lifestyle  Get enough sleep. Get 7-9 hours of sleep each night. Or get the amount of sleep that your doctor tells you to.  At bedtime, remove all electronic devices from your room. Examples of electronic devices are computers, phones, and tablets.  Find ways to lessen your stress. Some things that can lessen stress are: ? Exercise. ? Deep breathing. ? Yoga. ? Music. ? Positive thoughts.  Sit up straight. Do not tighten (tense) your muscles.  Do not use any products that have nicotine or tobacco in them, such as cigarettes and e-cigarettes. If you need help quitting, ask your doctor. General instructions   Keep all follow-up visits as told by your doctor. This is important.  Avoid things that can bring on headaches. Keep a journal to find out if certain things bring on headaches. For example, write down: ? What you eat and drink. ? How much sleep you get. ? Any change to your diet or medicines. Contact a doctor if:  Your headache does not get better.  Your headache comes back.  You have a headache and sounds, light, or smells bother you.  You feel sick to your stomach (nauseous) or you throw up (vomit).  Your stomach hurts. Get help right away if:  You suddenly get a very bad headache along with any of these: ? A stiff neck. ? Feeling sick to your stomach. ? Throwing up. ? Feeling weak. ? Trouble seeing. ? Feeling short of breath. ? A rash. ? Feeling unusually sleepy. ? Trouble speaking. ? Pain in your eye or ear. ? Trouble walking or balancing. ? Feeling like you will pass out (faint). ? Passing out. Summary  A tension headache is pain, pressure, or  aching in your head.  Tension headaches can last from 30 minutes to several days.  Lifestyle changes and medicines may help relieve pain. This information is not intended to replace advice given to you by your health care provider. Make sure you discuss any questions you have with your health care provider. Document Revised: 04/25/2019 Document Reviewed: 10/08/2016 Elsevier Patient Education  2020 [employer].

## 2019-09-25 ENCOUNTER — Encounter: Payer: Self-pay | Admitting: Family Medicine

## 2019-09-28 DIAGNOSIS — F900 Attention-deficit hyperactivity disorder, predominantly inattentive type: Secondary | ICD-10-CM | POA: Diagnosis not present

## 2020-01-28 DIAGNOSIS — Z03818 Encounter for observation for suspected exposure to other biological agents ruled out: Secondary | ICD-10-CM | POA: Diagnosis not present

## 2020-01-28 DIAGNOSIS — Z20822 Contact with and (suspected) exposure to covid-19: Secondary | ICD-10-CM | POA: Diagnosis not present

## 2020-04-24 DIAGNOSIS — F900 Attention-deficit hyperactivity disorder, predominantly inattentive type: Secondary | ICD-10-CM | POA: Diagnosis not present

## 2020-04-24 DIAGNOSIS — F3342 Major depressive disorder, recurrent, in full remission: Secondary | ICD-10-CM | POA: Diagnosis not present

## 2020-04-29 DIAGNOSIS — N644 Mastodynia: Secondary | ICD-10-CM | POA: Diagnosis not present

## 2020-05-08 ENCOUNTER — Other Ambulatory Visit: Payer: Self-pay | Admitting: Obstetrics and Gynecology

## 2020-05-08 DIAGNOSIS — N6320 Unspecified lump in the left breast, unspecified quadrant: Secondary | ICD-10-CM

## 2020-05-20 ENCOUNTER — Other Ambulatory Visit: Payer: Self-pay | Admitting: Obstetrics and Gynecology

## 2020-05-20 ENCOUNTER — Ambulatory Visit
Admission: RE | Admit: 2020-05-20 | Discharge: 2020-05-20 | Disposition: A | Discharge status: Home / Self Care | Payer: BC Managed Care – PPO | Source: Ambulatory Visit | Attending: Obstetrics and Gynecology | Admitting: Obstetrics and Gynecology

## 2020-05-20 ENCOUNTER — Other Ambulatory Visit: Payer: Self-pay

## 2020-05-20 DIAGNOSIS — N6489 Other specified disorders of breast: Secondary | ICD-10-CM | POA: Diagnosis not present

## 2020-05-20 DIAGNOSIS — R922 Inconclusive mammogram: Secondary | ICD-10-CM | POA: Diagnosis not present

## 2020-05-20 DIAGNOSIS — R2232 Localized swelling, mass and lump, left upper limb: Secondary | ICD-10-CM

## 2020-05-20 DIAGNOSIS — N6320 Unspecified lump in the left breast, unspecified quadrant: Secondary | ICD-10-CM

## 2020-05-20 LAB — HM MAMMOGRAPHY

## 2020-06-09 DIAGNOSIS — Z124 Encounter for screening for malignant neoplasm of cervix: Secondary | ICD-10-CM | POA: Diagnosis not present

## 2020-06-09 DIAGNOSIS — Z1231 Encounter for screening mammogram for malignant neoplasm of breast: Secondary | ICD-10-CM | POA: Diagnosis not present

## 2020-06-09 DIAGNOSIS — Z6822 Body mass index (BMI) 22.0-22.9, adult: Secondary | ICD-10-CM | POA: Diagnosis not present

## 2020-06-09 DIAGNOSIS — Z01419 Encounter for gynecological examination (general) (routine) without abnormal findings: Secondary | ICD-10-CM | POA: Diagnosis not present

## 2020-09-19 DIAGNOSIS — F411 Generalized anxiety disorder: Secondary | ICD-10-CM | POA: Diagnosis not present

## 2020-09-19 DIAGNOSIS — F900 Attention-deficit hyperactivity disorder, predominantly inattentive type: Secondary | ICD-10-CM | POA: Diagnosis not present

## 2020-09-22 ENCOUNTER — Ambulatory Visit (INDEPENDENT_AMBULATORY_CARE_PROVIDER_SITE_OTHER): Payer: BC Managed Care – PPO | Admitting: Family Medicine

## 2020-09-22 ENCOUNTER — Other Ambulatory Visit: Payer: Self-pay

## 2020-09-22 ENCOUNTER — Encounter: Payer: Self-pay | Admitting: Family Medicine

## 2020-09-22 VITALS — BP 109/66 | HR 75 | Temp 98.2°F | Ht 68.0 in | Wt 152.0 lb

## 2020-09-22 DIAGNOSIS — Z0000 Encounter for general adult medical examination without abnormal findings: Secondary | ICD-10-CM

## 2020-09-22 DIAGNOSIS — F909 Attention-deficit hyperactivity disorder, unspecified type: Secondary | ICD-10-CM

## 2020-09-22 DIAGNOSIS — F418 Other specified anxiety disorders: Secondary | ICD-10-CM

## 2020-09-22 DIAGNOSIS — Z13220 Encounter for screening for lipoid disorders: Secondary | ICD-10-CM

## 2020-09-22 DIAGNOSIS — Z1159 Encounter for screening for other viral diseases: Secondary | ICD-10-CM

## 2020-09-22 DIAGNOSIS — Z131 Encounter for screening for diabetes mellitus: Secondary | ICD-10-CM | POA: Diagnosis not present

## 2020-09-22 DIAGNOSIS — Z79899 Other long term (current) drug therapy: Secondary | ICD-10-CM | POA: Diagnosis not present

## 2020-09-22 LAB — COMPREHENSIVE METABOLIC PANEL
ALT: 11 U/L (ref 0–35)
AST: 12 U/L (ref 0–37)
Albumin: 4 g/dL (ref 3.5–5.2)
Alkaline Phosphatase: 56 U/L (ref 39–117)
BUN: 12 mg/dL (ref 6–23)
CO2: 28 mEq/L (ref 19–32)
Calcium: 9.4 mg/dL (ref 8.4–10.5)
Chloride: 103 mEq/L (ref 96–112)
Creatinine, Ser: 0.94 mg/dL (ref 0.40–1.20)
GFR: 74.89 mL/min (ref >60.00)
Glucose, Bld: 88 mg/dL (ref 70–99)
Potassium: 4.6 mEq/L (ref 3.5–5.1)
Sodium: 137 mEq/L (ref 135–145)
Total Bilirubin: 0.5 mg/dL (ref 0.2–1.2)
Total Protein: 6.3 g/dL (ref 6.0–8.3)

## 2020-09-22 LAB — CBC WITH DIFFERENTIAL/PLATELET
Basophils Absolute: 0.1 10*3/uL (ref 0.0–0.1)
Basophils Relative: 1 % (ref 0.0–3.0)
Eosinophils Absolute: 0.3 10*3/uL (ref 0.0–0.7)
Eosinophils Relative: 4.5 % (ref 0.0–5.0)
HCT: 40.2 % (ref 36.0–46.0)
Hemoglobin: 13.6 g/dL (ref 12.0–15.0)
Lymphocytes Relative: 30.6 % (ref 12.0–46.0)
Lymphs Abs: 1.8 10*3/uL (ref 0.7–4.0)
MCHC: 33.9 g/dL (ref 30.0–36.0)
MCV: 94.1 fl (ref 78.0–100.0)
Monocytes Absolute: 0.4 10*3/uL (ref 0.1–1.0)
Monocytes Relative: 7.4 % (ref 3.0–12.0)
Neutro Abs: 3.4 10*3/uL (ref 1.4–7.7)
Neutrophils Relative %: 56.5 % (ref 43.0–77.0)
Platelets: 243 10*3/uL (ref 150.0–400.0)
RBC: 4.28 Mil/uL (ref 3.87–5.11)
RDW: 12 % (ref 11.5–15.5)
WBC: 6 10*3/uL (ref 4.0–10.5)

## 2020-09-22 LAB — HEMOGLOBIN A1C: Hgb A1c MFr Bld: 5.4 % (ref 4.6–6.5)

## 2020-09-22 LAB — LIPID PANEL
Cholesterol: 179 mg/dL (ref 0–200)
HDL: 67.5 mg/dL (ref >39.00)
LDL Cholesterol: 91 mg/dL (ref 0–99)
NonHDL: 111.14
Total CHOL/HDL Ratio: 3
Triglycerides: 99 mg/dL (ref 0.0–149.0)
VLDL: 19.8 mg/dL (ref 0.0–40.0)

## 2020-09-22 LAB — TSH: TSH: 1.85 u[IU]/mL (ref 0.35–4.50)

## 2020-09-22 NOTE — Progress Notes (Signed)
This visit occurred during the SARS-CoV-2 public health emergency.  Safety protocols were in place, including screening questions prior to the visit, additional usage of staff PPE, and extensive cleaning of exam room while observing appropriate contact time as indicated for disinfecting solutions.    Patient ID: Deborah Mcpherson, female  DOB: 1975/09/21, 45 y.o.   MRN: [id number] Patient Care Team    Relationship Specialty Notifications Start End  Natalia Leatherwood, DO PCP - General Family Medicine  03/18/16   Levi Aland, MD Consulting Physician Obstetrics and Gynecology  08/26/17   Milagros Evener, MD Consulting Physician Psychiatry  08/26/17    Comment: ADD, depression/anxiety    Chief Complaint  Patient presents with  . Annual Exam    Pt is fasting;     Subjective: Deborah Mcpherson is a 45 y.o.  Female  present for CPE. All past medical history, surgical history, allergies, family history, immunizations, medications and social history were updated in the electronic medical record today. All recent labs, ED visits and hospitalizations within the last year were reviewed.  Health maintenance:  Colonoscopy: No fhx, screen at 47 Mammogram: 05/2020- UTD at gyn Cervical cancer screening: last pap: 2019(per pt), results: have been normal in the past. pt reports completed by: Dr. Malva Limes.  Immunizations: tdap UTD 12/2014, Influenza not a candidate- allergy. Covid vaccine x3.  Infectious disease screening: HIVcompleted and Hep C pt agreeable to testing. DEXA: N/A Assistive device: none Oxygen [id number] Patient has a Dental home. Hospitalizations/ED visits: reviewed  Depression screen [facility] 2/9 09/22/2020 09/21/2019 09/12/2018 08/26/2017 08/26/2017  Decreased Interest 0 0 0 0 0  Down, Depressed, Hopeless 0 0 0 0 0  PHQ - 2 Score 0 0 0 0 0  Altered sleeping - - 0 0 -  Tired, decreased energy - - 0 0 -  Change in appetite - - 0 0 -  Feeling bad or failure about yourself  - - 0 0 -   Trouble concentrating - - 1 0 -  Moving slowly or fidgety/restless - - 0 0 -  Suicidal thoughts - - 0 0 -  PHQ-9 Score - - 1 0 -  Difficult doing work/chores - - Not difficult at all Not difficult at all -   GAD 7 : Generalized Anxiety Score 09/12/2018  Nervous, Anxious, on Edge 0  Control/stop worrying 0  Worry too much - different things 0  Trouble relaxing 0  Restless 0  Easily annoyed or irritable 0  Afraid - awful might happen 1  Total GAD 7 Score 1  Anxiety Difficulty Not difficult at all     Immunization History  Administered Date(s) Administered  . PFIZER(Purple Top)SARS-COV-2 Vaccination 01/21/2020, 02/11/2020, 07/18/2020  . Tdap 01/07/2015    Past Medical History:  Diagnosis Date  . ADHD (attention deficit hyperactivity disorder)   . Anxiety   . Depression    Pristique and lexapro in past  . Migraine syndrome    stress is trigger  . Miscarriage 2001   Allergies  Allergen Reactions  . Eggs Or Egg-Derived Products Swelling    Tongue and lips swell  . Penicillins Hives and Anaphylaxis   Past Surgical History:  Procedure Laterality Date  . DILATION AND CURETTAGE OF UTERUS     miscarriage  . OOPHORECTOMY Left    tube and ovary removed--all benign  . OVARIAN CYST REMOVAL     x 3   Family History  Problem Relation Age of Onset  . Lung cancer  Father   . Alzheimer's disease Maternal Grandfather   . Alcohol abuse Paternal Grandfather   . Alcohol abuse Paternal Aunt   . Alcohol abuse Paternal Uncle    Social History   Social History Narrative   Married, has 41 y/o daughter.   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   Orig from [place] area.   Tob: 5 pack-yr hx, quit 2010.   Alcohol: occ glass of wine.    No drugs.    Allergies as of 09/22/2020      Reactions   Eggs Or Egg-derived Products Swelling   Tongue and lips swell   Penicillins Hives, Anaphylaxis      Medication List       Accurate as of September 22, 2020  8:32 AM. If you have any  questions, ask your nurse or doctor.        alprazolam 2 MG tablet Commonly known as: XANAX Take 2 mg by mouth at bedtime as needed for sleep.   amphetamine-dextroamphetamine 30 MG tablet Commonly known as: ADDERALL Take 30 mg by mouth 2 (two) times daily.   b complex vitamins tablet Take 1 tablet by mouth daily.   buPROPion 300 MG 24 hr tablet Commonly known as: WELLBUTRIN XL Take 300 mg by mouth at bedtime. What changed: Another medication with the same name was removed. Continue taking this medication, and follow the directions you see here. Changed by: Felix Pacini, DO   EPINEPHrine 0.3 mg/0.3 mL Soaj injection Commonly known as: EPI-PEN Inject 0.3 mLs (0.3 mg total) into the muscle as needed for anaphylaxis.   norethindrone-ethinyl estradiol 1-20 MG-MCG tablet Commonly known as: LOESTRIN Take 1 tablet by mouth daily.       All past medical history, surgical history, allergies, family history, immunizations andmedications were updated in the EMR today and reviewed under the history and medication portions of their EMR.     No results found for this or any previous visit (from the past 2160 hour(s)).   ROS: 14 pt review of systems performed and negative (unless mentioned in an HPI)  Objective: BP 109/66   Pulse 75   Temp 98.2 F (36.8 C) (Oral)   Ht 5\' 8"  (1.727 m)   Wt 152 lb (68.9 kg)   SpO2 100%   BMI 23.11 kg/m  Gen: Afebrile. No acute distress. Nontoxic in appearance, well-developed, well-nourished,  Very pleasant female.  HENT: AT. [place]. Bilateral TM visualized and normal in appearance, normal external auditory canal. MMM, no oral lesions, adequate dentition. Bilateral nares within normal limits. Throat without erythema, ulcerations or exudates. no Cough on exam, no hoarseness on exam. Eyes:Pupils Equal Round Reactive to light, Extraocular movements intact,  Conjunctiva without redness, discharge or icterus. Neck/lymp/endocrine: Supple,no lymphadenopathy, no  thyromegaly CV: RRR no murmur, no edema, +2/4 P posterior tibialis pulses.  Chest: CTAB, no wheeze, rhonchi or crackles. normal Respiratory effort. good Air movement. Abd: Soft. falt. NTND. BS present. no Masses palpated. No hepatosplenomegaly. No rebound tenderness or guarding. Skin: no rashes, purpura or petechiae. Warm and well-perfused. Skin intact. Neuro/Msk:  Normal gait. PERLA. EOMi. Alert. Oriented x3.  Cranial nerves II through XII intact. Muscle strength 5/5 upper/lower extremity. DTRs equal bilaterally. Psych: Normal affect, dress and demeanor. Normal speech. Normal thought content and judgment.   No exam data present  Assessment/plan: Deborah Mcpherson is a 45 y.o. female present for CPE Encounter for long-term current use of medication - CBC with Differential/Platelet - Comprehensive metabolic panel Depression with anxiety -  TSH Attention deficit hyperactivity disorder (ADHD), unspecified ADHD type - TSH Diabetes mellitus screening - Hemoglobin A1c Lipid screening - Lipid panel Need for hepatitis C screening test - Hepatitis C antibody Routine general medical examination at a health care facility Patient was encouraged to exercise greater than 150 minutes a week. Patient was encouraged to choose a diet filled with fresh fruits and vegetables, and lean meats. AVS provided to patient today for education/recommendation on gender specific health and safety maintenance. Colonoscopy: No fhx, screen at 47 Mammogram: 05/2020 UTD Cervical cancer screening: last pap: 2019(per pt), results: have been normal in the past. pt reports completed by: Dr. Malva Limes.  Immunizations: tdap UTD 12/2014, Influenza not a candidate- allergy. Covid vaccine x3.  Infectious disease screening: HIVcompleted and Hep C pt agreeable to testing. DEXA: N/A Return in about 1 year (around 09/22/2021) for CPE (30 min).  Orders Placed This Encounter  Procedures  . CBC with Differential/Platelet  .  Comprehensive metabolic panel  . Hemoglobin A1c  . Lipid panel  . TSH  . Hepatitis C antibody    No orders of the defined types were placed in this encounter.  Referral Orders  No referral(s) requested today     Electronically signed by: Felix Pacini, DO [facility] [place]

## 2020-09-22 NOTE — Patient Instructions (Signed)
Health Maintenance, Female Adopting a healthy lifestyle and getting preventive care are important in promoting health and wellness. Ask your health care provider about:  The right schedule for you to have regular tests and exams.  Things you can do on your own to prevent diseases and keep yourself healthy. What should I know about diet, weight, and exercise? Eat a healthy diet  Eat a diet that includes plenty of vegetables, fruits, low-fat dairy products, and lean protein.  Do not eat a lot of foods that are high in solid fats, added sugars, or sodium.   Maintain a healthy weight Body mass index (BMI) is used to identify weight problems. It estimates body fat based on height and weight. Your health care provider can help determine your BMI and help you achieve or maintain a healthy weight. Get regular exercise Get regular exercise. This is one of the most important things you can do for your health. Most adults should:  Exercise for at least 150 minutes each week. The exercise should increase your heart rate and make you sweat (moderate-intensity exercise).  Do strengthening exercises at least twice a week. This is in addition to the moderate-intensity exercise.  Spend less time sitting. Even light physical activity can be beneficial. Watch cholesterol and blood lipids Have your blood tested for lipids and cholesterol at 43 years of age, then have this test every 5 years. Have your cholesterol levels checked more often if:  Your lipid or cholesterol levels are high.  You are older than 43 years of age.  You are at high risk for heart disease. What should I know about cancer screening? Depending on your health history and family history, you may need to have cancer screening at various ages. This may include screening for:  Breast cancer.  Cervical cancer.  Colorectal cancer.  Skin cancer.  Lung cancer. What should I know about heart disease, diabetes, and high blood  pressure? Blood pressure and heart disease  High blood pressure causes heart disease and increases the risk of stroke. This is more likely to develop in people who have high blood pressure readings, are of African descent, or are overweight.  Have your blood pressure checked: ? Every 3-5 years if you are 18-39 years of age. ? Every year if you are 40 years old or older. Diabetes Have regular diabetes screenings. This checks your fasting blood sugar level. Have the screening done:  Once every three years after age 40 if you are at a normal weight and have a low risk for diabetes.  More often and at a younger age if you are overweight or have a high risk for diabetes. What should I know about preventing infection? Hepatitis B If you have a higher risk for hepatitis B, you should be screened for this virus. Talk with your health care provider to find out if you are at risk for hepatitis B infection. Hepatitis C Testing is recommended for:  Everyone born from 1945 through 1965.  Anyone with known risk factors for hepatitis C. Sexually transmitted infections (STIs)  Get screened for STIs, including gonorrhea and chlamydia, if: ? You are sexually active and are younger than 43 years of age. ? You are older than 43 years of age and your health care provider tells you that you are at risk for this type of infection. ? Your sexual activity has changed since you were last screened, and you are at increased risk for chlamydia or gonorrhea. Ask your health care provider   if you are at risk.  Ask your health care provider about whether you are at high risk for HIV. Your health care provider may recommend a prescription medicine to help prevent HIV infection. If you choose to take medicine to prevent HIV, you should first get tested for HIV. You should then be tested every 3 months for as long as you are taking the medicine. Pregnancy  If you are about to stop having your period (premenopausal) and  you may become pregnant, seek counseling before you get pregnant.  Take 400 to 800 micrograms (mcg) of folic acid every day if you become pregnant.  Ask for birth control (contraception) if you want to prevent pregnancy. Osteoporosis and menopause Osteoporosis is a disease in which the bones lose minerals and strength with aging. This can result in bone fractures. If you are 65 years old or older, or if you are at risk for osteoporosis and fractures, ask your health care provider if you should:  Be screened for bone loss.  Take a calcium or vitamin D supplement to lower your risk of fractures.  Be given hormone replacement therapy (HRT) to treat symptoms of menopause. Follow these instructions at home: Lifestyle  Do not use any products that contain nicotine or tobacco, such as cigarettes, e-cigarettes, and chewing tobacco. If you need help quitting, ask your health care provider.  Do not use street drugs.  Do not share needles.  Ask your health care provider for help if you need support or information about quitting drugs. Alcohol use  Do not drink alcohol if: ? Your health care provider tells you not to drink. ? You are pregnant, may be pregnant, or are planning to become pregnant.  If you drink alcohol: ? Limit how much you use to 0-1 drink a day. ? Limit intake if you are breastfeeding.  Be aware of how much alcohol is in your drink. In the [place] one drink equals one 12 oz bottle of beer (355 mL), one 5 oz glass of wine (148 mL), or one 1 oz glass of hard liquor (44 mL). General instructions  Schedule regular health, dental, and eye exams.  Stay current with your vaccines.  Tell your health care provider if: ? You often feel depressed. ? You have ever been abused or do not feel safe at home. Summary  Adopting a healthy lifestyle and getting preventive care are important in promoting health and wellness.  Follow your health care provider's instructions about healthy  diet, exercising, and getting tested or screened for diseases.  Follow your health care provider's instructions on monitoring your cholesterol and blood pressure. This information is not intended to replace advice given to you by your health care provider. Make sure you discuss any questions you have with your health care provider. Document Revised: 06/21/2018 Document Reviewed: 06/21/2018 Elsevier Patient Education  2021 Elsevier Inc.  

## 2020-09-23 LAB — HEPATITIS C ANTIBODY
Hepatitis C Ab: NONREACTIVE
SIGNAL TO CUT-OFF: 0.28 (ref <1.00)

## 2021-02-09 ENCOUNTER — Encounter: Payer: Self-pay | Admitting: Family Medicine

## 2021-02-09 NOTE — Telephone Encounter (Signed)
[facility] Day - Client TELEPHONE ADVICE RECORD AccessNurse Patient Name: MASAKO OVERALL [facility] Gender: Female DOB: [birth year] Age: 45 Y 9 M 20 D Return Phone Number: [phone] (Primary) Address: City/ State/ Zip: [place]  [address] Client [facility] Day - Client Client Site [facility] [place] - Day Physician Claiborne [place], [place] Contact Type Call Who Is Calling Patient / Member / Family / Caregiver Call Type Triage / Clinical Relationship To Patient Self Return Phone Number [phone] (Primary) Chief Complaint Health information question (non symptomatic) Reason for Call Symptomatic / Request for Health Information Initial Comment Caller said she has been having headaches lately that usually start around lunch time. Caller said yesterday when she bent over to pick up a peice of paper her nose started bleeding. Caller said she is fine right now but the office wanted her triaged. no current syptoms. Translation No Nurse Assessment Nurse: Kizzie Bane, RN, Marylene Land Date/Time (Eastern Time): 02/09/2021 9:46:59 AM Confirm and document reason for call. If symptomatic, describe symptoms. ---Caller states she has been having frequent headaches that started about 3 months ago. She had a nosebleed yesterday Does the patient have any new or worsening symptoms? ---Yes Will a triage be completed? ---Yes Related visit to physician within the last 2 weeks? ---No Does the PT have any chronic conditions? (i.e. diabetes, asthma, this includes High risk factors for pregnancy, etc.) ---No Is the patient pregnant or possibly pregnant? (Ask all females between the ages of 47-55) ---No Is this a behavioral health or substance abuse call? ---No Guidelines Guideline Title Affirmed Question Affirmed Notes Nurse Date/Time (Eastern Time) Headache Headache is a chronic symptom (recurrent or ongoing AND present > 4 weeks) Kizzie Bane, RN, Marylene Land  02/09/2021 9:48:29 AM Disp. Time Lamount Cohen Time) Disposition Final User PLEASE NOTE: All timestamps contained within this report are represented as [place] Standard Time. CONFIDENTIALTY NOTICE: This fax transmission is intended only for the addressee. It contains information that is legally privileged, confidential or otherwise protected from use or disclosure. If you are not the intended recipient, you are strictly prohibited from reviewing, disclosing, copying using or disseminating any of this information or taking any action in reliance on or regarding this information. If you have received this fax in error, please notify us immediately by telephone so that we can arrange for its return to [place]. Phone: [phone], Toll-Free: [phone], Fax: [phone] Page: 2 of 2 Call Id: [id number] 02/09/2021 9:51:47 AM See PCP within 2 Weeks Yes Kizzie Bane, RN, Rosalyn Charters Disagree/Comply Comply Caller Understands Yes PreDisposition Did not know what to do Care Advice Given Per Guideline SEE PCP WITHIN 2 WEEKS: * You need to be seen for this ongoing problem within the next 2 weeks. HEADACHE DIARY: * Keep a diary of the headaches: date, time, place, what you were doing at the time, intensity, duration, what helps, etc. * Reason: Try to find some of the triggers. CALL BACK IF: * Severe headache persists over 2 hours after pain medicine * You become worse * Headache lasts over 24 hours despite using a pain medicine Referrals REFERRED TO PCP OFFICE

## 2021-02-11 ENCOUNTER — Ambulatory Visit (INDEPENDENT_AMBULATORY_CARE_PROVIDER_SITE_OTHER): Payer: BC Managed Care – PPO | Admitting: Family Medicine

## 2021-02-11 ENCOUNTER — Other Ambulatory Visit: Payer: Self-pay

## 2021-02-11 ENCOUNTER — Encounter: Payer: Self-pay | Admitting: Family Medicine

## 2021-02-11 VITALS — BP 111/70 | HR 84 | Temp 98.1°F | Ht 68.0 in | Wt 151.0 lb

## 2021-02-11 DIAGNOSIS — R040 Epistaxis: Secondary | ICD-10-CM

## 2021-02-11 DIAGNOSIS — F458 Other somatoform disorders: Secondary | ICD-10-CM | POA: Diagnosis not present

## 2021-02-11 DIAGNOSIS — M26609 Unspecified temporomandibular joint disorder, unspecified side: Secondary | ICD-10-CM | POA: Diagnosis not present

## 2021-02-11 DIAGNOSIS — G44209 Tension-type headache, unspecified, not intractable: Secondary | ICD-10-CM

## 2021-02-11 MED ORDER — PREDNISONE 20 MG PO TABS: 40.0000 mg | ORAL_TABLET | Freq: Every day | ORAL | 0 refills | Status: DC

## 2021-02-11 MED ORDER — HYDROXYZINE HCL 25 MG PO TABS: 25.0000 mg | ORAL_TABLET | Freq: Two times a day (BID) | ORAL | 5 refills | Status: DC | PRN

## 2021-02-11 NOTE — Progress Notes (Signed)
This visit occurred during the SARS-CoV-2 public health emergency.  Safety protocols were in place, including screening questions prior to the visit, additional usage of staff PPE, and extensive cleaning of exam room while observing appropriate contact time as indicated for disinfecting solutions.    Deborah Mcpherson , 02-22-1976, 45 y.o., female MRN: [id number] Patient Care Team    Relationship Specialty Notifications Start End  Natalia Leatherwood, DO PCP - General Family Medicine  03/18/16   Levi Aland, MD Consulting Physician Obstetrics and Gynecology  08/26/17   Milagros Evener, MD Consulting Physician Psychiatry  08/26/17    Comment: ADD, depression/anxiety    Chief Complaint  Patient presents with   Headache    Pt c/o freq HA, and nose bleeds x 20mos; Pt has been taking Motrin taking to help with the HA but had a nosebleed Sunday     Subjective: Pt presents for an OV with complaints of having headaches over the last 3 to 6 months.  She also has been taking increased Motrin, almost daily, sometimes multiple times in the day to help with her headaches.  She reports she is always had tension headaches.  However these seem to be worsening.  She does endorse having an accident a few months ago where a drill kicked back and hit her on the left side of her jaw.  She states she was pretty bruised and swollen, was never seen for this condition. Her headaches are in her temples bilaterally.  She will feel discomfort pulling in her upper neck/occipital region at times as well.  She states this past Sunday she bent over and she had a nosebleed which concerned her.  She states she had a headache Saturday and had taken quite a bit of Motrin the day before.  She has noticed more easy bruising.  She denies any dizziness, visual disturbances or thunderclap headache.  She does endorse clenching her teeth throughout the day.  She thinks she may also grind her teeth at night.  She has not seen her dental  team for this condition.  Depression screen [name] 2/9 09/22/2020 09/21/2019 09/12/2018 08/26/2017 08/26/2017  Decreased Interest 0 0 0 0 0  Down, Depressed, Hopeless 0 0 0 0 0  PHQ - 2 Score 0 0 0 0 0  Altered sleeping - - 0 0 -  Tired, decreased energy - - 0 0 -  Change in appetite - - 0 0 -  Feeling bad or failure about yourself  - - 0 0 -  Trouble concentrating - - 1 0 -  Moving slowly or fidgety/restless - - 0 0 -  Suicidal thoughts - - 0 0 -  PHQ-9 Score - - 1 0 -  Difficult doing work/chores - - Not difficult at all Not difficult at all -    Allergies  Allergen Reactions   Eggs Or Egg-Derived Products Swelling    Tongue and lips swell   Penicillins Hives and Anaphylaxis   Social History   Social History Narrative   Married, has 35 y/o daughter.   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   Orig from [place] area.   Tob: 5 pack-yr hx, quit 2010.   Alcohol: occ glass of wine.    No drugs.   Past Medical History:  Diagnosis Date   ADHD (attention deficit hyperactivity disorder)    Anxiety    Depression    Pristique and lexapro in past   Migraine syndrome    stress  is trigger   Miscarriage 2001   Past Surgical History:  Procedure Laterality Date   DILATION AND CURETTAGE OF UTERUS     miscarriage   OOPHORECTOMY Left    tube and ovary removed--all benign   OVARIAN CYST REMOVAL     x 3   Family History  Problem Relation Age of Onset   Lung cancer Father    Alzheimer's disease Maternal Grandfather    Alcohol abuse Paternal Grandfather    Alcohol abuse Paternal Aunt    Alcohol abuse Paternal Uncle    Allergies as of 02/11/2021       Reactions   Eggs Or Egg-derived Products Swelling   Tongue and lips swell   Penicillins Hives, Anaphylaxis        Medication List        Accurate as of February 11, 2021  2:28 PM. If you have any questions, ask your nurse or doctor.          alprazolam 2 MG tablet Commonly known as: XANAX Take 2 mg by mouth at bedtime  as needed for sleep.   amphetamine-dextroamphetamine 30 MG tablet Commonly known as: ADDERALL Take 30 mg by mouth 2 (two) times daily.   b complex vitamins tablet Take 1 tablet by mouth daily.   buPROPion 300 MG 24 hr tablet Commonly known as: WELLBUTRIN XL Take 300 mg by mouth at bedtime.   EPINEPHrine 0.3 mg/0.3 mL Soaj injection Commonly known as: EPI-PEN Inject 0.3 mLs (0.3 mg total) into the muscle as needed for anaphylaxis.   norethindrone-ethinyl estradiol 1-20 MG-MCG tablet Commonly known as: LOESTRIN Take 1 tablet by mouth daily.        All past medical history, surgical history, allergies, family history, immunizations andmedications were updated in the EMR today and reviewed under the history and medication portions of their EMR.     ROS: Negative, with the exception of above mentioned in HPI   Objective:  BP 111/70   Pulse 84   Temp 98.1 F (36.7 C) (Oral)   Ht 5\' 8"  (1.727 m)   Wt 151 lb (68.5 kg)   SpO2 99%   BMI 22.96 kg/m  Body mass index is 22.96 kg/m. Gen: Afebrile. No acute distress. Nontoxic in appearance, well developed, well nourished.  HENT: AT. [place]. Bilateral TM visualized without erythema or effusion. MMM, no oral lesions. Bilateral nares without erythema, swelling or drainage. Throat without erythema or exudates.  No cough.  No hoarseness. Eyes:Pupils Equal Round Reactive to light, Extraocular movements intact,  Conjunctiva without redness, discharge or icterus. Neck/lymp/endocrine: Supple, no lymphadenopathy MSK: Bilateral TMJ joints tender to palpation.  Decreased range of motion in flexion of mandible with mild right deviation present.  Popping present left TMJ joint with flexion.  Normal range of motion cervical spine.  Currently no tenderness present cervical paraspinal muscle. Skin: No rashes petechiae purpura.  X3 small bruises over different parts of her arms/legs present. Neuro:Normal gait. PERLA. EOMi. Alert. Oriented x3 C Psych:  Normal affect, dress and demeanor. Normal speech. Normal thought content and judgment.  No results found. No results found. No results found for this or any previous visit (from the past 24 hour(s)).  Assessment/Plan: ELETHA CULBERTSON is a 44 y.o. female present for OV for  Epistaxis Believe this is secondary to the overuse of NSAIDs to help with her headaches. Recommend she discontinue NSAID use.  Rebound headaches are also potential with chronic NSAID use.  TMJ syndrome/tension headache/Bruxism Discussed TMJ, tension headaches  and bruxism with her today. She has had tension headaches for some time, suspect the worsening of the tension headaches is likely to her injury a few months back when her drill kicked back and hit her in the left side of the jaw. Bruxism is present with her which will also increase the tension headache.  Bruxism could be a side effect of Adderall, although she does not take her Adderall daily. I have encouraged her to follow-up with her dental team/oral surgeon for further evaluation on her left TMJ joint and TMJ syndrome treatment.  She may be a candidate for TMJ splints. I also encouraged her to look into nighttime dental appliance to help avoid grinding and wear on teeth. For acute condition prednisone taper prescribed.  She is to avoid all NSAIDs. Vistaril prescribed scheduled nightly and lower dose if needed throughout the day to help with tension headache or bruxism.  Could consider Flexeril in the future if Vistaril is not working well enough. Would encourage follow-up in 4 weeks  Reviewed expectations re: course of current medical issues. Discussed self-management of symptoms. Outlined signs and symptoms indicating need for more acute intervention. Patient verbalized understanding and all questions were answered. Patient received an After-Visit Summary.    No orders of the defined types were placed in this encounter.  No orders of the defined types were  placed in this encounter.  Referral Orders  No referral(s) requested today     Note is dictated utilizing voice recognition software. Although note has been proof read prior to signing, occasional typographical errors still can be missed. If any questions arise, please do not hesitate to call for verification.   electronically signed by:  Felix Pacini, DO  [facility] - [place]

## 2021-02-11 NOTE — Patient Instructions (Signed)
Temporomandibular Joint Syndrome  Temporomandibular joint syndrome (TMJ syndrome) is a condition that causes pain in the temporomandibular joints. These joints are located near your ears and allow your jaw to open and close. For people with TMJ syndrome, chewing,biting, or other movements of the jaw can be difficult or painful. TMJ syndrome is often mild and goes away within a few weeks. However, sometimes the condition becomes a long-term (chronic) problem. What are the causes? This condition may be caused by: Grinding your teeth or clenching your jaw. Some people do this when they are under stress. Arthritis. Injury to the jaw. Head or neck injury. Teeth or dentures that are not aligned well. In some cases, the cause of TMJ syndrome may not be known. What are the signs or symptoms? The most common symptom of this condition is an aching pain on the side of the head in the area of the TMJ. Other symptoms may include: Pain when moving your jaw, such as when chewing or biting. Being unable to open your jaw all the way. Making a clicking sound when you open your mouth. Headache. Earache. Neck or shoulder pain. How is this diagnosed? This condition may be diagnosed based on: Your symptoms and medical history. A physical exam. Your health care provider may check the range of motion of your jaw. Imaging tests, such as X-rays or an MRI. You may also need to see your dentist, who will determine if your teeth and jaware lined up correctly. How is this treated? TMJ syndrome often goes away on its own. If treatment is needed, the options may include: Eating soft foods and applying ice or heat. Medicines to relieve pain or inflammation. Medicines or massage to relax the muscles. A splint, bite plate, or mouthpiece to prevent teeth grinding or jaw clenching. Relaxation techniques or counseling to help reduce stress. A therapy for pain in which an electrical current is applied to the nerves  through the skin (transcutaneous electrical nerve stimulation). Acupuncture. This is sometimes helpful to relieve pain. Jaw surgery. This is rarely needed. Follow these instructions at home:  Eating and drinking Eat a soft diet if you are having trouble chewing. Avoid foods that require a lot of chewing. Do not chew gum. General instructions Take over-the-counter and prescription medicines only as told by your health care provider. If directed, put ice on the painful area. Put ice in a plastic bag. Place a towel between your skin and the bag. Leave the ice on for 20 minutes, 2-3 times a day. Apply a warm, wet cloth (warm compress) to the painful area as directed. Massage your jaw area and do any jaw stretching exercises as told by your health care provider. If you were given a splint, bite plate, or mouthpiece, wear it as told by your health care provider. Keep all follow-up visits as told by your health care provider. This is important. Contact a health care provider if: You are having trouble eating. You have new or worsening symptoms. Get help right away if: Your jaw locks open or closed. Summary Temporomandibular joint syndrome (TMJ syndrome) is a condition that causes pain in the temporomandibular joints. These joints are located near your ears and allow your jaw to open and close. TMJ syndrome is often mild and goes away within a few weeks. However, sometimes the condition becomes a long-term (chronic) problem. Symptoms include an aching pain on the side of the head in the area of the TMJ, pain when chewing or biting, and being unable   to open your jaw all the way. You may also make a clicking sound when you open your mouth. TMJ syndrome often goes away on its own. If treatment is needed, it may include medicines to relieve pain, reduce inflammation, or relax the muscles. A splint, bite plate, or mouthpiece may also be used to prevent teeth grinding or jaw clenching. This information  is not intended to replace advice given to you by your health care provider. Make sure you discuss any questions you have with your healthcare provider. Document Revised: 09/09/2017 Document Reviewed: 08/09/2017 Elsevier Patient Education  2022 Elsevier Inc.  

## 2021-02-12 ENCOUNTER — Encounter: Payer: Self-pay | Admitting: Family Medicine

## 2021-02-12 DIAGNOSIS — R040 Epistaxis: Secondary | ICD-10-CM | POA: Insufficient documentation

## 2021-02-12 DIAGNOSIS — F458 Other somatoform disorders: Secondary | ICD-10-CM | POA: Insufficient documentation

## 2021-02-12 DIAGNOSIS — M26609 Unspecified temporomandibular joint disorder, unspecified side: Secondary | ICD-10-CM | POA: Insufficient documentation

## 2021-02-12 HISTORY — DX: Epistaxis: R04.0

## 2021-03-13 DIAGNOSIS — F900 Attention-deficit hyperactivity disorder, predominantly inattentive type: Secondary | ICD-10-CM | POA: Diagnosis not present

## 2021-03-13 DIAGNOSIS — F3342 Major depressive disorder, recurrent, in full remission: Secondary | ICD-10-CM | POA: Diagnosis not present

## 2021-04-21 IMAGING — MG MM DIGITAL DIAGNOSTIC UNILAT*L* W/ TOMO W/ CAD
6 series · 6 of 18 positions shown · non-contrast
Comparison: Previous exam(s).

CLINICAL DATA: Palpable abnormality in the LEFT axilla for 3 weeks.
Patient had Sovid-ZW vaccine in the LEFT arm in [REDACTED].

EXAM:
DIGITAL DIAGNOSTIC LEFT MAMMOGRAM WITH CAD AND TOMO
ULTRASOUND LEFT AXILLA

[L MLO synth-2D]
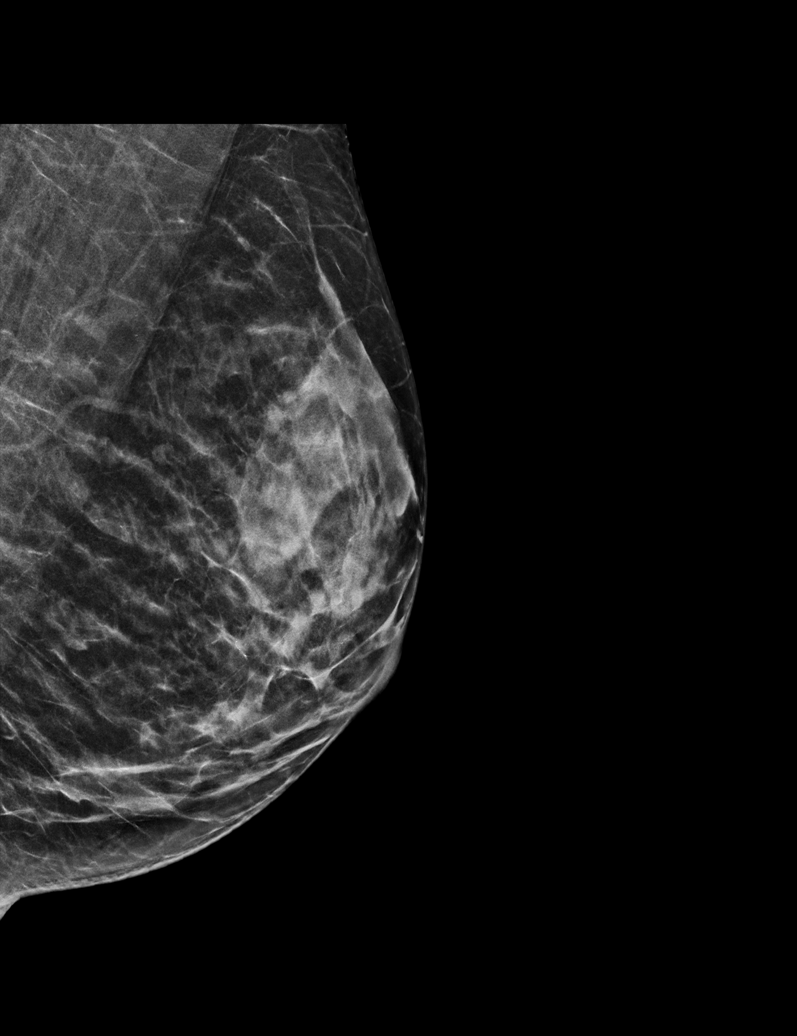

[L CC synth-2D]
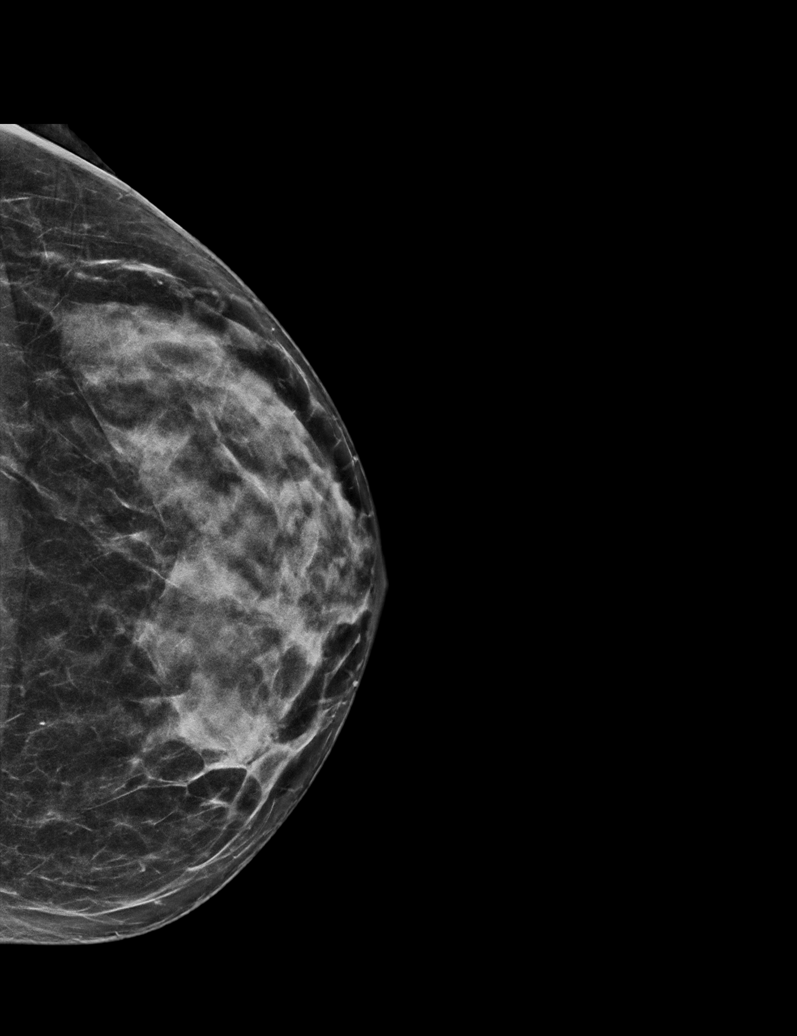

[L TAN synth-2D]
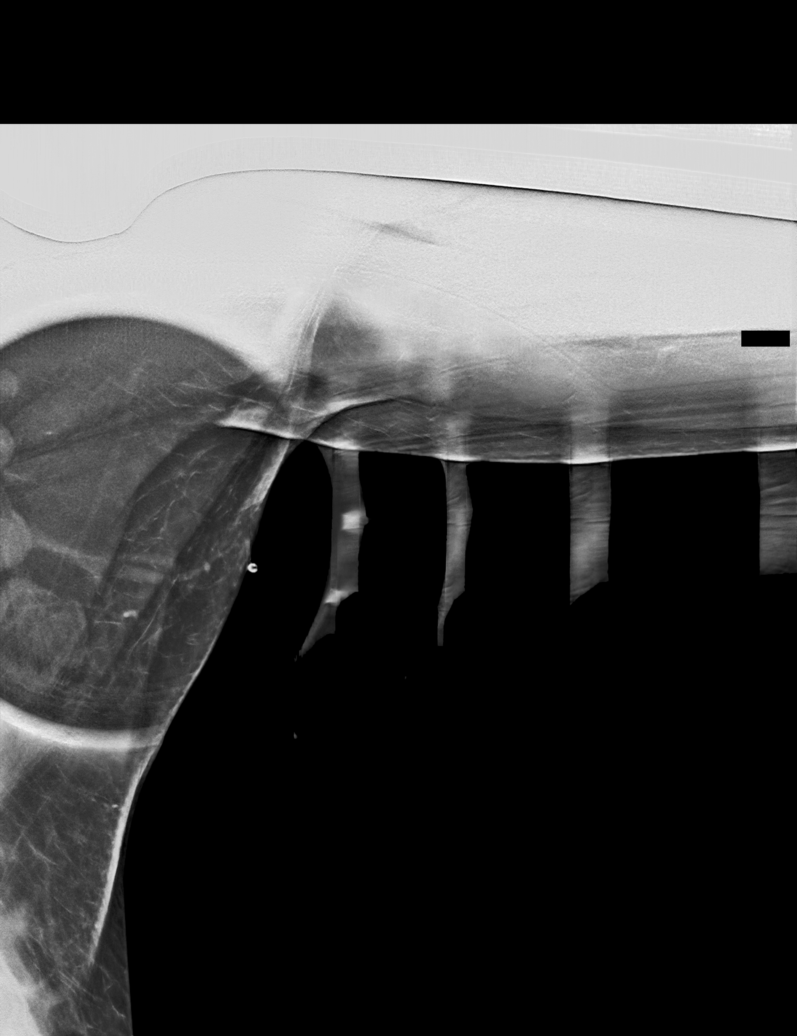

[L MLO tomo · tomo slice 27/54.0]
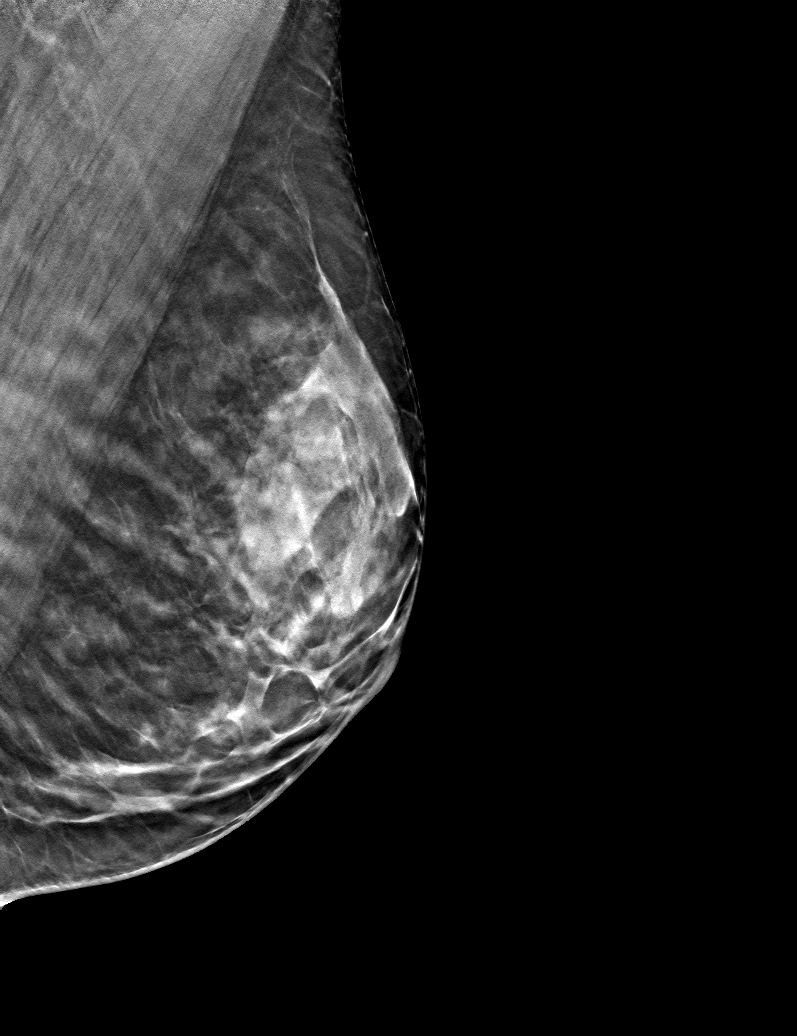

[L TAN tomo · tomo slice 35/70.0]
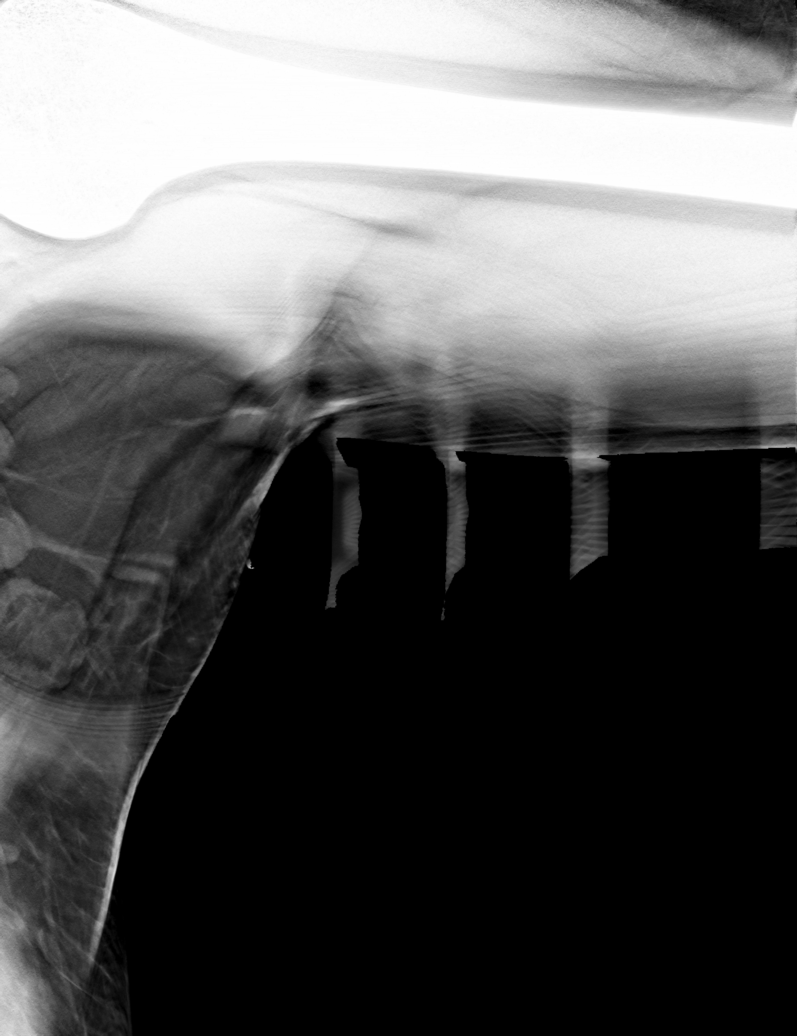

[L CC tomo · tomo slice 29/57.0]
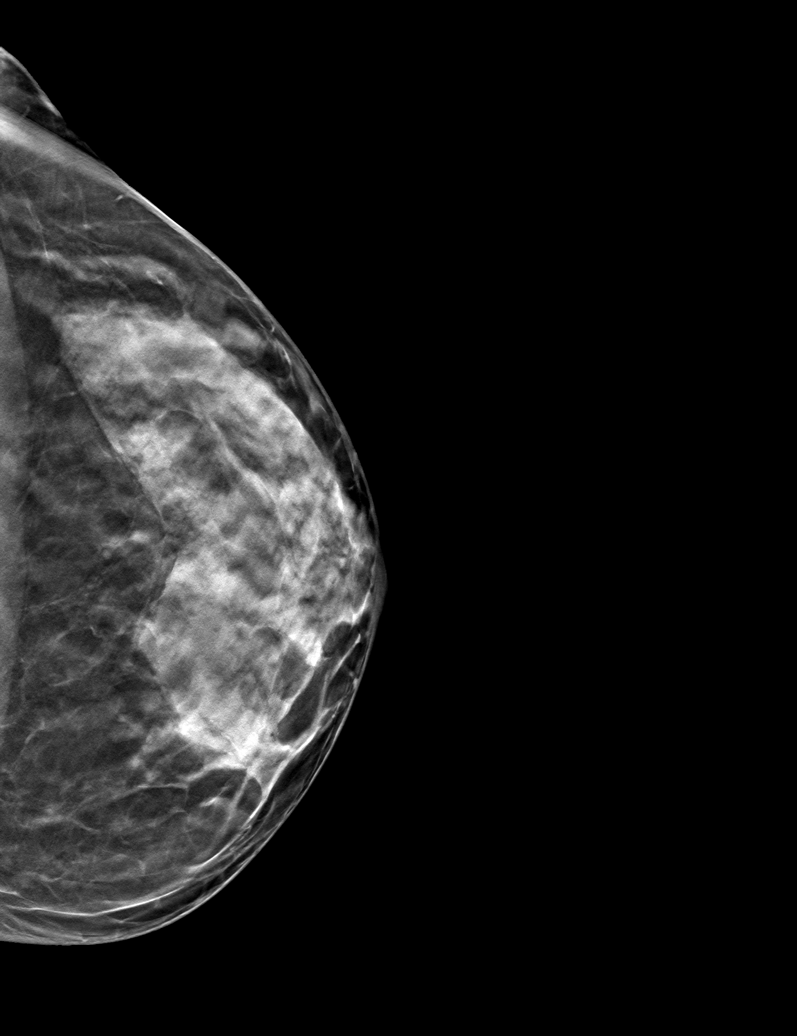

[6 of 18 positions shown; findings below may reference images not displayed]

ACR Breast Density Category c: The breast tissue is heterogeneously
dense, which may obscure small masses.
FINDINGS: No suspicious mass, distortion, or microcalcifications are
identified to suggest presence of malignancy. Spot tangential view
in the LEFT axilla shows normal appearing fibroglandular tissue
superficial to lymph nodes with normal morphology.

Mammographic images were processed with CAD.

On physical exam, I palpate soft small discrete mass along the
anterior LOWER LEFT axilla corresponding to the area of patient's
concern.

Targeted ultrasound is performed, showing normal appearing LEFT
axillary lymph nodes in the LEFT axilla. The palpable abnormality
corresponds to a lymph node with normal morphology. No additional
masses are identified.
IMPRESSION: No mammographic or ultrasound evidence for malignancy. No axillary
adenopathy.

RECOMMENDATION:
Recommend screening mammogram after 05/28/2020.

I have discussed the findings and recommendations with the patient.
If applicable, a reminder letter will be sent to the patient
regarding the next appointment.

BI-RADS CATEGORY  2: Benign.

## 2021-05-24 ENCOUNTER — Encounter: Payer: Self-pay | Admitting: Family Medicine

## 2021-06-10 ENCOUNTER — Encounter: Payer: Self-pay | Admitting: Neurology

## 2021-06-10 DIAGNOSIS — Z01419 Encounter for gynecological examination (general) (routine) without abnormal findings: Secondary | ICD-10-CM | POA: Diagnosis not present

## 2021-06-10 DIAGNOSIS — Z1231 Encounter for screening mammogram for malignant neoplasm of breast: Secondary | ICD-10-CM | POA: Diagnosis not present

## 2021-06-10 DIAGNOSIS — Z6822 Body mass index (BMI) 22.0-22.9, adult: Secondary | ICD-10-CM | POA: Diagnosis not present

## 2021-06-10 LAB — HM MAMMOGRAPHY

## 2021-09-02 DIAGNOSIS — F3342 Major depressive disorder, recurrent, in full remission: Secondary | ICD-10-CM | POA: Diagnosis not present

## 2021-09-02 DIAGNOSIS — F900 Attention-deficit hyperactivity disorder, predominantly inattentive type: Secondary | ICD-10-CM | POA: Diagnosis not present

## 2021-09-04 NOTE — Progress Notes (Signed)
NEUROLOGY CONSULTATION NOTE  Deborah Mcpherson MRN: [id number] DOB: Jun 08, 1976  Referring provider: Irene Pap, MD Primary care provider: Howard Pouch, DO  Reason for consult:  headache  Assessment/Plan:   Migraine without aura, without status migrainosus, not intractable  Migraine prevention:  start topiramate 25mg  at bedtime for one week, then 50mg  at bedtime Migraine rescue: Stop Motrin.  Start Maxalt-MLT 10mg  and Zofran ODT 4mg  Limit use of pain relievers to no more than 2 days out of week to prevent risk of rebound or medication-overuse headache. Keep headache diary Follow up 4 months    Subjective:  Deborah Mcpherson is a 46 year old female with ADHD, depression, anxiety who presents for headaches.  History supplemented by referring provider's note.  Onset:  Since highschool.  Progressively worse over the years..  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   Location:  right frontal Quality:  pounding/throbbing Intensity:  7/10.  She denies new headache, thunderclap headache or severe headache that wakes her from sleep. Aura:  absent Prodrome:  absent Associated symptoms:  nausea, sometimes vomiting, osmophobia, photophobia, phonophobia, sometimes right eye twitches.  She denies associated visual disturbance, unilateral numbness or weakness. Duration:  at least 4 hours. Frequency:  3 days a week Frequency of abortive medication: she was taking Motrin 3 days a week Triggers:  stress, perfumes Relieving factors:  nothing Activity:  aggravates  Current NSAIDS/analgesics:  Motrin Current triptans:  none Current ergotamine:  none Current anti-emetic:  none Current muscle relaxants:  none Current Antihypertensive medications:  none Current Antidepressant medications:  bupropion XL 300mg  daily Current Anticonvulsant medications:  none Current anti-CGRP:  none Current Vitamins/Herbal/Supplements:  B Current  Antihistamines/Decongestants:  hydroxyzine Other therapy:  none Hormone/birth control:  LOESTRIN Other medications:  Adderall  Past NSAIDS/analgesics:  BC powder, acetaminophen Past abortive triptans:  none Past abortive ergotamine:  none Past muscle relaxants:  none Past anti-emetic:  none Past antihypertensive medications:  none Past antidepressant medications:  duloxetine Past anticonvulsant medications:  none Past anti-CGRP:  none Past vitamins/Herbal/Supplements:  none Past antihistamines/decongestants:  Flonase Other past therapies:  none  Caffeine:  1 cup some mornings.  Tea daily Diet:  4-5 16oz bottles water daily.  Does not skip meals Alcohol:  3-4 beers a night Exercise:  run Depression:  no; Anxiety:  no but work-related stress Other pain:  none Sleep hygiene:  good Family history of headache:  mom (migraine)      PAST MEDICAL HISTORY: Past Medical History:  Diagnosis Date   ADHD (attention deficit hyperactivity disorder)    Anxiety    Depression    Pristique and lexapro in past   Migraine syndrome    stress is trigger   Miscarriage 2001    PAST SURGICAL HISTORY: Past Surgical History:  Procedure Laterality Date   DILATION AND CURETTAGE OF UTERUS     miscarriage   OOPHORECTOMY Left    tube and ovary removed--all benign   OVARIAN CYST REMOVAL     x 3    MEDICATIONS: Current Outpatient Medications on File Prior to Visit  Medication Sig Dispense Refill   amphetamine-dextroamphetamine (ADDERALL) 30 MG tablet Take 30 mg by mouth 2 (two) times daily.     b complex vitamins tablet Take 1 tablet by mouth daily.     buPROPion (WELLBUTRIN XL) 300 MG 24 hr tablet Take 300 mg by mouth at bedtime.     EPINEPHrine 0.3 mg/0.3 mL IJ SOAJ injection  Inject 0.3 mLs (0.3 mg total) into the muscle as needed for anaphylaxis. 1 Device 0   hydrOXYzine (ATARAX/VISTARIL) 25 MG tablet Take 1-3 tablets (25-75 mg total) by mouth every 12 (twelve) hours as needed. 90 tablet  5   norethindrone-ethinyl estradiol (LOESTRIN) 1-20 MG-MCG tablet Take 1 tablet by mouth daily.     predniSONE (DELTASONE) 20 MG tablet Take 2 tablets (40 mg total) by mouth daily with breakfast. 10 tablet 0   No current facility-administered medications on file prior to visit.    ALLERGIES: Allergies  Allergen Reactions   Eggs Or Egg-Derived Products Swelling    Tongue and lips swell   Penicillins Hives and Anaphylaxis    FAMILY HISTORY: Family History  Problem Relation Age of Onset   Lung cancer Father    Alzheimer's disease Maternal Grandfather    Alcohol abuse Paternal Grandfather    Alcohol abuse Paternal Aunt    Alcohol abuse Paternal Uncle     Objective:  Blood pressure 128/85, pulse (!) 105, height 5\' 9"  (1.753 m), weight 160 lb (72.6 kg), SpO2 94 %. General: No acute distress.  Patient appears well-groomed.   Head:  Normocephalic/atraumatic Eyes:  fundi examined but not visualized Neck: supple, no paraspinal tenderness, full range of motion Back: No paraspinal tenderness Heart: regular rate and rhythm Lungs: Clear to auscultation bilaterally. Vascular: No carotid bruits. Neurological Exam: Mental status: alert and oriented to person, place, and time, recent and remote memory intact, fund of knowledge intact, attention and concentration intact, speech fluent and not dysarthric, language intact. Cranial nerves: CN I: not tested CN II: pupils equal, round and reactive to light, visual fields intact CN III, IV, VI:  full range of motion, no nystagmus, no ptosis CN V: facial sensation intact. CN VII: upper and lower face symmetric CN VIII: hearing intact CN IX, X: gag intact, uvula midline CN XI: sternocleidomastoid and trapezius muscles intact CN XII: tongue midline Bulk & Tone: normal, no fasciculations. Motor:  muscle strength 5/5 throughout Sensation:  Pinprick, temperature and vibratory sensation intact. Deep Tendon Reflexes:  2+ throughout,  toes downgoing.    Finger to nose testing:  Without dysmetria.   Heel to shin:  Without dysmetria.   Gait:  Normal station and stride.  Romberg negative.    Thank you for allowing me to take part in the care of this patient.  Metta Clines, DO  CC:  Howard Pouch, DO  Irene Pap, MD

## 2021-09-07 ENCOUNTER — Encounter: Payer: Self-pay | Admitting: Neurology

## 2021-09-07 ENCOUNTER — Other Ambulatory Visit: Payer: Self-pay

## 2021-09-07 ENCOUNTER — Other Ambulatory Visit: Payer: Self-pay | Admitting: Neurology

## 2021-09-07 ENCOUNTER — Ambulatory Visit: Payer: BC Managed Care – PPO | Admitting: Neurology

## 2021-09-07 VITALS — BP 128/85 | HR 105 | Ht 69.0 in | Wt 160.0 lb

## 2021-09-07 DIAGNOSIS — G43009 Migraine without aura, not intractable, without status migrainosus: Secondary | ICD-10-CM | POA: Diagnosis not present

## 2021-09-07 MED ORDER — RIZATRIPTAN BENZOATE 10 MG PO TBDP: 10.0000 mg | ORAL_TABLET | ORAL | 5 refills | Status: DC | PRN

## 2021-09-07 MED ORDER — TOPIRAMATE 25 MG PO TABS: ORAL_TABLET | ORAL | 0 refills | Status: DC

## 2021-09-07 MED ORDER — ONDANSETRON 4 MG PO TBDP: 4.0000 mg | ORAL_TABLET | Freq: Three times a day (TID) | ORAL | 5 refills | Status: DC | PRN

## 2021-09-07 NOTE — Telephone Encounter (Signed)
Refill request received from the patient pharmacy: Please send alternate medication Rizatriptan not covered.   Advised patient please give her pharmacy a call to see if they were told what the alternate medication will be. If not call insurance to see what they will let her know.  Pt will send [place] a mychart message to let us know what she found out.

## 2021-09-07 NOTE — Patient Instructions (Signed)
°  Start topiramate 25mg .  Take 1 tablet at bedtime for one week, then 2 tablets at bedtime.  Contact in 5 weeks with update and we can increase dose if needed. Take rizatriptan 10mg  at earliest onset of headache.  May repeat dose once in 2 hours if needed.  Maximum 2 tablets in 24 hours. Ondansetron for nausea Stop Motrin Limit use of pain relievers to no more than 2 days out of the week.  These medications include acetaminophen, NSAIDs (ibuprofen/Advil/Motrin, naproxen/Aleve, triptans (Imitrex/sumatriptan), Excedrin, and narcotics.  This will help reduce risk of rebound headaches. Be aware of common food triggers:  - Caffeine:  coffee, black tea, cola, Mt. Dew  - Chocolate  - Dairy:  aged cheeses (brie, blue, cheddar, gouda, [place], provolone, [place], Swiss, etc), chocolate milk, buttermilk, sour cream, limit eggs and yogurt  - Nuts, peanut butter  - Alcohol  - Cereals/grains:  FRESH breads (fresh bagels, sourdough, doughnuts), yeast productions  - Processed/canned/aged/cured meats (pre-packaged deli meats, hotdogs)  - MSG/glutamate:  soy sauce, flavor enhancer, pickled/preserved/marinated foods  - Sweeteners:  aspartame (Equal, Nutrasweet).  Sugar and Splenda are okay  - Vegetables:  legumes (lima beans, lentils, snow peas, fava beans, pinto peans, peas, garbanzo beans), sauerkraut, onions, olives, pickles  - Fruit:  avocados, bananas, citrus fruit (orange, lemon, grapefruit), mango  - Other:  Frozen meals, macaroni and cheese Routine exercise Stay adequately hydrated (aim for 64 oz water daily) Keep headache diary Maintain proper stress management Maintain proper sleep hygiene Do not skip meals Consider supplements:  magnesium citrate 400mg  daily, riboflavin 400mg  daily, coenzyme Q10 100mg  three times daily.

## 2021-09-08 ENCOUNTER — Other Ambulatory Visit: Payer: Self-pay | Admitting: Neurology

## 2021-09-08 ENCOUNTER — Other Ambulatory Visit: Payer: Self-pay

## 2021-09-08 ENCOUNTER — Telehealth: Payer: Self-pay | Admitting: Neurology

## 2021-09-08 MED ORDER — RIZATRIPTAN BENZOATE 10 MG PO TABS
10.0000 mg | ORAL_TABLET | ORAL | 5 refills | Status: DC | PRN
Start: 2021-09-08 — End: 2022-06-30

## 2021-09-08 MED ORDER — PROPRANOLOL HCL 60 MG PO TABS: 60.0000 mg | ORAL_TABLET | Freq: Every day | ORAL | 5 refills | Status: DC

## 2021-09-08 NOTE — Progress Notes (Signed)
propranolol ER 60mg  daily.  We can increase to 80mg  daily in 4 weeks.  Caution for drowsiness

## 2021-09-08 NOTE — Telephone Encounter (Signed)
The following message was left with [employer] on 09/07/21 at 5:10PM.  Caller saw Dr. Everlena Cooper this morning. She was put on three medications to help with migraines but pharmacist says they counteract her birth control. Denies any new or worsening symptoms.

## 2021-09-08 NOTE — Telephone Encounter (Signed)
[facility] message sent to patient.

## 2021-09-23 ENCOUNTER — Encounter: Payer: BC Managed Care – PPO | Admitting: Family Medicine

## 2021-09-28 ENCOUNTER — Other Ambulatory Visit: Payer: Self-pay

## 2021-09-28 MED ORDER — NORTRIPTYLINE HCL 10 MG PO CAPS: 10.0000 mg | ORAL_CAPSULE | Freq: Every day | ORAL | 3 refills | Status: DC

## 2021-10-06 ENCOUNTER — Encounter: Payer: BC Managed Care – PPO | Admitting: Family Medicine

## 2021-11-03 ENCOUNTER — Encounter: Payer: Self-pay | Admitting: Family Medicine

## 2021-11-03 ENCOUNTER — Ambulatory Visit (INDEPENDENT_AMBULATORY_CARE_PROVIDER_SITE_OTHER): Payer: BC Managed Care – PPO | Admitting: Family Medicine

## 2021-11-03 VITALS — BP 93/63 | HR 69 | Temp 98.2°F | Ht 68.0 in | Wt 161.0 lb

## 2021-11-03 DIAGNOSIS — F419 Anxiety disorder, unspecified: Secondary | ICD-10-CM

## 2021-11-03 DIAGNOSIS — Z13220 Encounter for screening for lipoid disorders: Secondary | ICD-10-CM

## 2021-11-03 DIAGNOSIS — Z0000 Encounter for general adult medical examination without abnormal findings: Secondary | ICD-10-CM

## 2021-11-03 DIAGNOSIS — Z131 Encounter for screening for diabetes mellitus: Secondary | ICD-10-CM | POA: Diagnosis not present

## 2021-11-03 DIAGNOSIS — Z79899 Other long term (current) drug therapy: Secondary | ICD-10-CM | POA: Diagnosis not present

## 2021-11-03 LAB — COMPREHENSIVE METABOLIC PANEL
ALT: 12 U/L (ref 0–35)
AST: 14 U/L (ref 0–37)
Albumin: 4.2 g/dL (ref 3.5–5.2)
Alkaline Phosphatase: 58 U/L (ref 39–117)
BUN: 15 mg/dL (ref 6–23)
CO2: 26 mEq/L (ref 19–32)
Calcium: 9.1 mg/dL (ref 8.4–10.5)
Chloride: 102 mEq/L (ref 96–112)
Creatinine, Ser: 0.88 mg/dL (ref 0.40–1.20)
GFR: 80.43 mL/min (ref >60.00)
Glucose, Bld: 93 mg/dL (ref 70–99)
Potassium: 4.1 mEq/L (ref 3.5–5.1)
Sodium: 136 mEq/L (ref 135–145)
Total Bilirubin: 0.5 mg/dL (ref 0.2–1.2)
Total Protein: 6.3 g/dL (ref 6.0–8.3)

## 2021-11-03 LAB — CBC
HCT: 40.2 % (ref 36.0–46.0)
Hemoglobin: 13.4 g/dL (ref 12.0–15.0)
MCHC: 33.3 g/dL (ref 30.0–36.0)
MCV: 96.5 fl (ref 78.0–100.0)
Platelets: 266 10*3/uL (ref 150.0–400.0)
RBC: 4.17 Mil/uL (ref 3.87–5.11)
RDW: 12.6 % (ref 11.5–15.5)
WBC: 6.8 10*3/uL (ref 4.0–10.5)

## 2021-11-03 LAB — LIPID PANEL
Cholesterol: 167 mg/dL (ref 0–200)
HDL: 70.2 mg/dL (ref >39.00)
LDL Cholesterol: 76 mg/dL (ref 0–99)
NonHDL: 96.41
Total CHOL/HDL Ratio: 2
Triglycerides: 104 mg/dL (ref 0.0–149.0)
VLDL: 20.8 mg/dL (ref 0.0–40.0)

## 2021-11-03 LAB — HEMOGLOBIN A1C: Hgb A1c MFr Bld: 5.5 % (ref 4.6–6.5)

## 2021-11-03 LAB — TSH: TSH: 1.81 u[IU]/mL (ref 0.35–5.50)

## 2021-11-03 NOTE — Patient Instructions (Signed)
?Great to see you today.  ? ? ?If labs were collected, we will inform you of lab results once received either by echart message or telephone call.  ? - echart message- for normal results that have been seen by the patient already.  ? - telephone call: abnormal results or if patient has not viewed results in their echart. ? ?Health Maintenance, Female ?Adopting a healthy lifestyle and getting preventive care are important in promoting health and wellness. Ask your health care provider about: ?The right schedule for you to have regular tests and exams. ?Things you can do on your own to prevent diseases and keep yourself healthy. ?What should I know about diet, weight, and exercise? ?Eat a healthy diet ? ?Eat a diet that includes plenty of vegetables, fruits, low-fat dairy products, and lean protein. ?Do not eat a lot of foods that are high in solid fats, added sugars, or sodium. ?Maintain a healthy weight ?Body mass index (BMI) is used to identify weight problems. It estimates body fat based on height and weight. Your health care provider can help determine your BMI and help you achieve or maintain a healthy weight. ?Get regular exercise ?Get regular exercise. This is one of the most important things you can do for your health. Most adults should: ?Exercise for at least 150 minutes each week. The exercise should increase your heart rate and make you sweat (moderate-intensity exercise). ?Do strengthening exercises at least twice a week. This is in addition to the moderate-intensity exercise. ?Spend less time sitting. Even light physical activity can be beneficial. ?Watch cholesterol and blood lipids ?Have your blood tested for lipids and cholesterol at 44 years of age, then have this test every 5 years. ?Have your cholesterol levels checked more often if: ?Your lipid or cholesterol levels are high. ?You are older than 44 years of age. ?You are at high risk for heart disease. ?What should I know about cancer  screening? ?Depending on your health history and family history, you may need to have cancer screening at various ages. This may include screening for: ?Breast cancer. ?Cervical cancer. ?Colorectal cancer. ?Skin cancer. ?Lung cancer. ?What should I know about heart disease, diabetes, and high blood pressure? ?Blood pressure and heart disease ?High blood pressure causes heart disease and increases the risk of stroke. This is more likely to develop in people who have high blood pressure readings or are overweight. ?Have your blood pressure checked: ?Every 3-5 years if you are 52-95 years of age. ?Every year if you are 15 years old or older. ?Diabetes ?Have regular diabetes screenings. This checks your fasting blood sugar level. Have the screening done: ?Once every three years after age 54 if you are at a normal weight and have a low risk for diabetes. ?More often and at a younger age if you are overweight or have a high risk for diabetes. ?What should I know about preventing infection? ?Hepatitis B ?If you have a higher risk for hepatitis B, you should be screened for this virus. Talk with your health care provider to find out if you are at risk for hepatitis B infection. ?Hepatitis C ?Testing is recommended for: ?Everyone born from [birth year] through 1963. ?Anyone with known risk factors for hepatitis C. ?Sexually transmitted infections (STIs) ?Get screened for STIs, including gonorrhea and chlamydia, if: ?You are sexually active and are younger than 44 years of age. ?You are older than 44 years of age and your health care provider tells you that you are at risk  for this type of infection. ?Your sexual activity has changed since you were last screened, and you are at increased risk for chlamydia or gonorrhea. Ask your health care provider if you are at risk. ?Ask your health care provider about whether you are at high risk for HIV. Your health care provider may recommend a prescription medicine to help prevent HIV  infection. If you choose to take medicine to prevent HIV, you should first get tested for HIV. You should then be tested every 3 months for as long as you are taking the medicine. ?Pregnancy ?If you are about to stop having your period (premenopausal) and you may become pregnant, seek counseling before you get pregnant. ?Take 400 to 800 micrograms (mcg) of folic acid every day if you become pregnant. ?Ask for birth control (contraception) if you want to prevent pregnancy. ?Osteoporosis and menopause ?Osteoporosis is a disease in which the bones lose minerals and strength with aging. This can result in bone fractures. If you are 62 years old or older, or if you are at risk for osteoporosis and fractures, ask your health care provider if you should: ?Be screened for bone loss. ?Take a calcium or vitamin D supplement to lower your risk of fractures. ?Be given hormone replacement therapy (HRT) to treat symptoms of menopause. ?Follow these instructions at home: ?Alcohol use ?Do not drink alcohol if: ?Your health care provider tells you not to drink. ?You are pregnant, may be pregnant, or are planning to become pregnant. ?If you drink alcohol: ?Limit how much you have to: ?0-1 drink a day. ?Know how much alcohol is in your drink. In the [place] one drink equals one 12 oz bottle of beer (355 mL), one 5 oz glass of wine (148 mL), or one 1? oz glass of hard liquor (44 mL). ?Lifestyle ?Do not use any products that contain nicotine or tobacco. These products include cigarettes, chewing tobacco, and vaping devices, such as e-cigarettes. If you need help quitting, ask your health care provider. ?Do not use street drugs. ?Do not share needles. ?Ask your health care provider for help if you need support or information about quitting drugs. ?General instructions ?Schedule regular health, dental, and eye exams. ?Stay current with your vaccines. ?Tell your health care provider if: ?You often feel depressed. ?You have ever been abused  or do not feel safe at home. ?Summary ?Adopting a healthy lifestyle and getting preventive care are important in promoting health and wellness. ?Follow your health care provider's instructions about healthy diet, exercising, and getting tested or screened for diseases. ?Follow your health care provider's instructions on monitoring your cholesterol and blood pressure. ?This information is not intended to replace advice given to you by your health care provider. Make sure you discuss any questions you have with your health care provider. ?Document Revised: 11/17/2020 Document Reviewed: 11/17/2020 ?Elsevier Patient Education ? [place]. ? ? ?

## 2021-11-03 NOTE — Progress Notes (Signed)
? ?This visit occurred during the SARS-CoV-2 public health emergency.  Safety protocols were in place, including screening questions prior to the visit, additional usage of staff PPE, and extensive cleaning of exam room while observing appropriate contact time as indicated for disinfecting solutions.  ? ? ?Patient ID: Deborah Mcpherson, female  DOB: 1976/04/14, 46 y.o.   MRN: [id number] ?Patient Care Team  ?  Relationship Specialty Notifications Start End  ?Natalia Leatherwood, DO PCP - General Family Medicine  03/18/16   ?Levi Aland, MD Consulting Physician Obstetrics and Gynecology  08/26/17   ?Milagros Evener, MD Consulting Physician Psychiatry  08/26/17   ? Comment: ADD, depression/anxiety  ?Drema Dallas, DO Consulting Physician Neurology  09/07/21   ? ? ?Chief Complaint  ?Patient presents with  ? Annual Exam  ?  Pt is fasting  ? ? ?Subjective: ?Deborah Mcpherson is a 46 y.o.  Female  present for CPE. All past medical history, surgical history, allergies, family history, immunizations, medications and social history were updated in the electronic medical record today. ?All recent labs, ED visits and hospitalizations within the last year were reviewed. ? ?Health maintenance:  ?Colonoscopy: No fhx, screen at 47 ?Mammogram: 05/2021- UTD at gyn ?Cervical cancer screening: last pap: 2022 (per pt), results: have been normal in the past. pt reports  completed by: Dr. Malva Limes.  ?Immunizations: tdap UTD 12/2014, Influenza not a candidate- allergy. Covid vaccine x3.  ?Infectious disease screening: HIV completed and Hep C completed ?DEXA: N/A ?Assistive device: none ?Oxygen FTD:DUKG ?Patient has a Dental home. ?Hospitalizations/ED visits: reviewed ? ? ? ?  11/03/2021  ? 10:22 AM 09/22/2020  ?  8:04 AM 09/21/2019  ?  8:19 AM 09/12/2018  ?  1:06 PM 08/26/2017  ? 11:15 AM  ?Depression screen PHQ 2/9  ?Decreased Interest 0 0 0 0 0  ?Down, Depressed, Hopeless 0 0 0 0 0  ?PHQ - 2 Score 0 0 0 0 0  ?Altered sleeping    0 0  ?Tired,  decreased energy    0 0  ?Change in appetite    0 0  ?Feeling bad or failure about yourself     0 0  ?Trouble concentrating    1 0  ?Moving slowly or fidgety/restless    0 0  ?Suicidal thoughts    0 0  ?PHQ-9 Score    1 0  ?Difficult doing work/chores    Not difficult at all Not difficult at all  ? ? ?  09/12/2018  ?  1:06 PM  ?GAD 7 : Generalized Anxiety Score  ?Nervous, Anxious, on Edge 0  ?Control/stop worrying 0  ?Worry too much - different things 0  ?Trouble relaxing 0  ?Restless 0  ?Easily annoyed or irritable 0  ?Afraid - awful might happen 1  ?Total GAD 7 Score 1  ?Anxiety Difficulty Not difficult at all  ? ? ?Immunization History  ?Administered Date(s) Administered  ? PFIZER(Purple Top)SARS-COV-2 Vaccination 01/21/2020, 02/11/2020, 07/18/2020  ? Tdap 01/07/2015  ? ? ?Past Medical History:  ?Diagnosis Date  ? ADHD (attention deficit hyperactivity disorder)   ? Anxiety   ? Depression   ? Pristique and lexapro in past  ? Migraine syndrome   ? stress is trigger  ? Miscarriage 2001  ? ?Allergies  ?Allergen Reactions  ? Eggs Or Egg-Derived Products Swelling  ?  Tongue and lips swell  ? Penicillins Hives and Anaphylaxis  ? ?Past Surgical History:  ?Procedure Laterality Date  ? DILATION  AND CURETTAGE OF UTERUS    ? miscarriage  ? OOPHORECTOMY Left   ? tube and ovary removed--all benign  ? OVARIAN CYST REMOVAL    ? x 3  ? ?Family History  ?Problem Relation Age of Onset  ? Lung cancer Father   ? Alzheimer's disease Maternal Grandfather   ? Alcohol abuse Paternal Grandfather   ? Alcohol abuse Paternal Aunt   ? Alcohol abuse Paternal Uncle   ? ?Social History  ? ?Social History Narrative  ? 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 No drugs.  ?   ? Right handed  ? ? ?Allergies as of 11/03/2021   ? ?   Reactions  ? Eggs Or Egg-derived Products Swelling  ? Tongue and lips swell  ? Penicillins Hives, Anaphylaxis   ? ?  ? ?  ?Medication List  ?  ? ?  ? Accurate as of November 03, 2021 10:39 AM. If you have any questions, ask your nurse or doctor.  ?  ?  ? ?  ? ?STOP taking these medications   ? ?hydrOXYzine 25 MG tablet ?Commonly known as: ATARAX ?Stopped by: Felix Pacini, DO ?  ?nortriptyline 10 MG capsule ?Commonly known as: PAMELOR ?Stopped by: Felix Pacini, DO ?  ?propranolol 60 MG tablet ?Commonly known as: INDERAL ?Stopped by: Felix Pacini, DO ?  ? ?  ? ?TAKE these medications   ? ?amphetamine-dextroamphetamine 30 MG tablet ?Commonly known as: ADDERALL ?Take 30 mg by mouth 2 (two) times daily. ?  ?b complex vitamins tablet ?Take 1 tablet by mouth daily. ?  ?buPROPion 300 MG 24 hr tablet ?Commonly known as: WELLBUTRIN XL ?Take 300 mg by mouth at bedtime. ?  ?EPINEPHrine 0.3 mg/0.3 mL Soaj injection ?Commonly known as: EPI-PEN ?Inject 0.3 mLs (0.3 mg total) into the muscle as needed for anaphylaxis. ?  ?norethindrone-ethinyl estradiol 1-20 MG-MCG tablet ?Commonly known as: LOESTRIN ?Take 1 tablet by mouth daily. ?  ?ondansetron 4 MG disintegrating tablet ?Commonly known as: ZOFRAN-ODT ?Take 1 tablet (4 mg total) by mouth every 8 (eight) hours as needed for nausea or vomiting. ?  ?rizatriptan 10 MG tablet ?Commonly known as: MAXALT ?Take 1 tablet (10 mg total) by mouth as needed for migraine. May repeat in 2 hours if needed ?  ? ?  ? ? ?All past medical history, surgical history, allergies, family history, immunizations andmedications were updated in the EMR today and reviewed under the history and medication portions of their EMR.    ? ?No results found for this or any previous visit (from the past 2160 hour(s)). ? ?ROS ?14 pt review of systems performed and negative (unless mentioned in an HPI) ? ?Objective: ?BP 93/63   Pulse 69   Temp 98.2 ?F (36.8 ?C) (Oral)   Ht 5\' 8"  (1.727 m)   Wt 161 lb (73 kg)   SpO2 100%   BMI 24.48 kg/m?  ?Physical Exam ?Vitals and nursing note reviewed.  ?Constitutional:   ?   General: She  is not in acute distress. ?   Appearance: Normal appearance. She is not ill-appearing or toxic-appearing.  ?HENT:  ?   Head: Normocephalic and atraumatic.  ?   Right Ear: Tympanic membrane, ear canal and external ear normal. There is no impacted cerumen.  ?   Left Ear: Tympanic membrane, ear canal and external ear normal. There is no impacted cerumen.  ?   Nose:  No congestion or rhinorrhea.  ?   Mouth/Throat:  ?   Mouth: Mucous membranes are moist.  ?   Pharynx: Oropharynx is clear. No oropharyngeal exudate or posterior oropharyngeal erythema.  ?Eyes:  ?   General: No scleral icterus.    ?   Right eye: No discharge.     ?   Left eye: No discharge.  ?   Extraocular Movements: Extraocular movements intact.  ?   Conjunctiva/sclera: Conjunctivae normal.  ?   Pupils: Pupils are equal, round, and reactive to light.  ?Cardiovascular:  ?   Rate and Rhythm: Normal rate and regular rhythm.  ?   Pulses: Normal pulses.  ?   Heart sounds: Normal heart sounds. No murmur heard. ?  No friction rub. No gallop.  ?Pulmonary:  ?   Effort: Pulmonary effort is normal. No respiratory distress.  ?   Breath sounds: Normal breath sounds. No stridor. No wheezing, rhonchi or rales.  ?Chest:  ?   Chest wall: No tenderness.  ?Abdominal:  ?   General: Abdomen is flat. Bowel sounds are normal. There is no distension.  ?   Palpations: Abdomen is soft. There is no mass.  ?   Tenderness: There is no abdominal tenderness. There is no right CVA tenderness, left CVA tenderness, guarding or rebound.  ?   Hernia: No hernia is present.  ?Musculoskeletal:     ?   General: No swelling, tenderness or deformity. Normal range of motion.  ?   Cervical back: Normal range of motion and neck supple. No rigidity or tenderness.  ?   Right lower leg: No edema.  ?   Left lower leg: No edema.  ?Lymphadenopathy:  ?   Cervical: No cervical adenopathy.  ?Skin: ?   General: Skin is warm and dry.  ?   Coloration: Skin is not jaundiced or pale.  ?   Findings: No bruising,  erythema, lesion or rash.  ?Neurological:  ?   General: No focal deficit present.  ?   Mental Status: She is alert and oriented to person, place, and time. Mental status is at baseline.  ?   Cranial Nerves: No cranial n

## 2022-02-22 DIAGNOSIS — F900 Attention-deficit hyperactivity disorder, predominantly inattentive type: Secondary | ICD-10-CM | POA: Diagnosis not present

## 2022-02-22 DIAGNOSIS — F3342 Major depressive disorder, recurrent, in full remission: Secondary | ICD-10-CM | POA: Diagnosis not present

## 2022-03-02 NOTE — Progress Notes (Unsigned)
NEUROLOGY FOLLOW UP OFFICE NOTE  ALEKSANDRA RABEN [id number]  Assessment/Plan:   Migraine without aura, without status migrainosus, not intractable   Migraine prevention:  start topiramate 25mg  at bedtime for one week, then 50mg  at bedtime Migraine rescue: Stop Motrin.  Start Maxalt-MLT 10mg  and Zofran ODT 4mg  Limit use of pain relievers to no more than 2 days out of week to prevent risk of rebound or medication-overuse headache. Keep headache diary Follow up 4 months       Subjective:  ANIESA BOBACK is a 46 year old female with ADHD, depression, anxiety who follows up for migraines.  UPDATE: Didn't want to start topiramate due to potential interaction with her birth control.  I suggested propranolol but she didn't want to take it due to potential side effects.  I then prescribed nortriptyline but didn't want to take it because of black box warning of suicidal ideation.  Also the pharmacist told her  not to stay out in the sun and she owns a landscaping business.  She opted to remain off of a preventative.   Intensity:  *** Duration:  *** Frequency:  *** Frequency of abortive medication: *** Current NSAIDS/analgesics:  none Current triptans:  Maxalt-MLT 10mg  Current ergotamine:  none Current anti-emetic:  Zofran ODT 4mg  Current muscle relaxants:  none Current Antihypertensive medications:  none Current Antidepressant medications:  bupropion XL 300mg  daily Current Anticonvulsant medications:  none Current anti-CGRP:  none Current Vitamins/Herbal/Supplements:  B Current Antihistamines/Decongestants:  hydroxyzine Other therapy:  none Hormone/birth control:  LOESTRIN Other medications:  Adderall  Caffeine:  1 cup some mornings.  Tea daily Diet:  4-5 16oz bottles water daily.  Does not skip meals Alcohol:  3-4 beers a night Exercise:  run Depression:  no; Anxiety:  no but work-related stress Other pain:  none Sleep hygiene:  good  HISTORY:  Onset:  Since highschool.   Progressively worse over the years..  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   Location:  right frontal Quality:  pounding/throbbing Intensity:  7/10.  She denies new headache, thunderclap headache or severe headache that wakes her from sleep. Aura:  absent Prodrome:  absent Associated symptoms:  nausea, sometimes vomiting, osmophobia, photophobia, phonophobia, sometimes right eye twitches.  She denies associated visual disturbance, unilateral numbness or weakness. Duration:  at least 4 hours. Frequency:  3 days a week Frequency of abortive medication: she was taking Motrin 3 days a week Triggers:  stress, perfumes Relieving factors:  nothing Activity:  aggravates      Past NSAIDS/analgesics:  ibuprofen, BC powder, acetaminophen Past abortive triptans:  none Past abortive ergotamine:  none Past muscle relaxants:  none Past anti-emetic:  none Past antihypertensive medications:  none Past antidepressant medications:  duloxetine Past anticonvulsant medications:  none Past anti-CGRP:  none Past vitamins/Herbal/Supplements:  none Past antihistamines/decongestants:  Flonase Other past therapies:  none    Family history of headache:  mom (migraine)  PAST MEDICAL HISTORY: Past Medical History:  Diagnosis Date   ADHD (attention deficit hyperactivity disorder)    Anxiety    Depression    Pristique and lexapro in past   Migraine syndrome    stress is trigger   Miscarriage 2001    MEDICATIONS: Current Outpatient Medications on File Prior to Visit  Medication Sig Dispense Refill   amphetamine-dextroamphetamine (ADDERALL) 30 MG tablet Take 30 mg by mouth 2 (two) times daily.     b complex vitamins tablet Take 1  tablet by mouth daily.     buPROPion (WELLBUTRIN XL) 300 MG 24 hr tablet Take 300 mg by mouth at bedtime.     EPINEPHrine 0.3 mg/0.3 mL IJ SOAJ injection Inject 0.3 mLs (0.3 mg total) into the muscle as needed for anaphylaxis.  1 Device 0   norethindrone-ethinyl estradiol (LOESTRIN) 1-20 MG-MCG tablet Take 1 tablet by mouth daily.     ondansetron (ZOFRAN-ODT) 4 MG disintegrating tablet Take 1 tablet (4 mg total) by mouth every 8 (eight) hours as needed for nausea or vomiting. 20 tablet 5   rizatriptan (MAXALT) 10 MG tablet Take 1 tablet (10 mg total) by mouth as needed for migraine. May repeat in 2 hours if needed 10 tablet 5   No current facility-administered medications on file prior to visit.    ALLERGIES: Allergies  Allergen Reactions   Eggs Or Egg-Derived Products Swelling    Tongue and lips swell   Penicillins Hives and Anaphylaxis    FAMILY HISTORY: Family History  Problem Relation Age of Onset   Lung cancer Father    Alzheimer's disease Maternal Grandfather    Alcohol abuse Paternal Grandfather    Alcohol abuse Paternal Aunt    Alcohol abuse Paternal Uncle       Objective:  *** General: No acute distress.  Patient appears ***-groomed.   Head:  Normocephalic/atraumatic Eyes:  Fundi examined but not visualized Neck: supple, no paraspinal tenderness, full range of motion Heart:  Regular rate and rhythm Lungs:  Clear to auscultation bilaterally Back: No paraspinal tenderness Neurological Exam: alert and oriented to person, place, and time.  Speech fluent and not dysarthric, language intact.  CN II-XII intact. Bulk and tone normal, muscle strength 5/5 throughout.  Sensation to light touch intact.  Deep tendon reflexes 2+ throughout, toes downgoing.  Finger to nose testing intact.  Gait normal, Romberg negative.   Shon Millet, DO  CC: ***

## 2022-03-03 ENCOUNTER — Ambulatory Visit: Payer: BC Managed Care – PPO | Admitting: Neurology

## 2022-03-03 ENCOUNTER — Encounter: Payer: Self-pay | Admitting: Neurology

## 2022-03-03 VITALS — BP 115/73 | HR 91 | Ht 69.0 in | Wt 160.2 lb

## 2022-03-03 DIAGNOSIS — G43009 Migraine without aura, not intractable, without status migrainosus: Secondary | ICD-10-CM | POA: Diagnosis not present

## 2022-03-03 MED ORDER — AIMOVIG 140 MG/ML ~~LOC~~ SOAJ: 140.0000 mg | SUBCUTANEOUS | 5 refills | Status: DC

## 2022-03-03 MED ORDER — SUMATRIPTAN SUCCINATE 100 MG PO TABS: 100.0000 mg | ORAL_TABLET | ORAL | 5 refills | Status: DC | PRN

## 2022-03-03 NOTE — Patient Instructions (Signed)
Start Aimovig 140mg  every 28 days Stop rizatriptan.  At earliest onset of migraine, use sumatriptan.  Zofran for nausea.  May use Motrin Limit use of pain relievers to no more than 2 days out of week to prevent risk of rebound or medication-overuse headache. Keep headache diary Follow up 4 months.

## 2022-03-04 ENCOUNTER — Telehealth (HOSPITAL_COMMUNITY): Payer: Self-pay | Admitting: Pharmacy Technician

## 2022-03-04 NOTE — Telephone Encounter (Signed)
Patient Advocate Encounter   Received notification that prior authorization for Aimovig 140MG /ML auto-injectors is required.   PA submitted on 03/04/2022 Key [id number] Status is pending       03/06/2022, CPhT Pharmacy Patient Advocate Specialist [facility] Patient Advocate Team Direct Number: [phone]  Fax: [phone]

## 2022-03-08 NOTE — Telephone Encounter (Signed)
Patient Advocate Encounter  Prior Authorization for Aimovig 140MG /ML auto-injectors has been approved.    PA# Effective dates: 03/04/2022 through 05/26/2022      05/28/2022, CPhT Pharmacy Patient Advocate Specialist [facility] Patient Advocate Team Direct Number: [phone]  Fax: [phone]

## 2022-03-16 ENCOUNTER — Other Ambulatory Visit (HOSPITAL_COMMUNITY): Payer: Self-pay

## 2022-03-16 ENCOUNTER — Encounter: Payer: Self-pay | Admitting: Neurology

## 2022-04-15 ENCOUNTER — Other Ambulatory Visit: Payer: Self-pay | Admitting: Family Medicine

## 2022-04-15 DIAGNOSIS — T781XXA Other adverse food reactions, not elsewhere classified, initial encounter: Secondary | ICD-10-CM

## 2022-04-15 MED ORDER — EPINEPHRINE 0.3 MG/0.3ML IJ SOAJ: 0.3000 mg | INTRAMUSCULAR | 0 refills | Status: AC | PRN

## 2022-04-16 MED ORDER — AJOVY 225 MG/1.5ML ~~LOC~~ SOAJ: 1.0000 | SUBCUTANEOUS | 3 refills | Status: DC

## 2022-05-05 ENCOUNTER — Telehealth: Payer: Self-pay

## 2022-05-05 NOTE — Telephone Encounter (Signed)
Pt needs a PA done for Ajovy every 28 days

## 2022-05-06 NOTE — Telephone Encounter (Signed)
Patient Advocate Encounter   Received notification that prior authorization for AJOVY (fremanezumab-vfrm) injection 225MG /1.5ML auto-injectors is required.   PA submitted on 05/06/2022 Key [id number] Status is pending       Lyndel Safe, [place] Patient Advocate Specialist [place] Patient Advocate Team Direct Number: [phone]  Fax: [phone]

## 2022-05-11 NOTE — Telephone Encounter (Signed)
Patient Advocate Encounter  Prior Authorization for AJOVY (fremanezumab-vfrm) injection 225MG /1.5ML auto-injectors has been approved.     Effective dates: 05/06/2022 through 07/28/2022      Lyndel Safe, [place] Patient Advocate Specialist [place] Patient Advocate Team Direct Number: [phone]  Fax: [phone]

## 2022-06-28 DIAGNOSIS — Z1231 Encounter for screening mammogram for malignant neoplasm of breast: Secondary | ICD-10-CM | POA: Diagnosis not present

## 2022-06-28 DIAGNOSIS — Z01419 Encounter for gynecological examination (general) (routine) without abnormal findings: Secondary | ICD-10-CM | POA: Diagnosis not present

## 2022-06-28 LAB — HM MAMMOGRAPHY

## 2022-06-28 NOTE — Progress Notes (Unsigned)
NEUROLOGY FOLLOW UP OFFICE NOTE  Deborah Mcpherson [id number]  Assessment/Plan:   Migraine without aura, without status migrainosus, not intractable   Migraine prevention:  Aimovig 140mg  *** Migraine rescue: sumatriptan 100mg , Continue Zofran as needed. *** Limit use of pain relievers to no more than 2 days out of week to prevent risk of rebound or medication-overuse headache. Keep headache diary Follow up 4 months       Subjective:  Deborah Mcpherson is a 46 year old female with ADHD, depression, anxiety who follows up for migraines.   UPDATE: *** Intensity:  severe Duration:  2 hours Frequency:  2 to 3 a week Frequency of abortive medication: 2-3 days a week Current NSAIDS/analgesics:  none Current triptans:  Maxalt-MLT 10mg  Current ergotamine:  none Current anti-emetic:  Zofran ODT 4mg  Current muscle relaxants:  none Current Antihypertensive medications:  baseline low blood pressure Current Antidepressant medications:  bupropion XL 300mg  daily Current Anticonvulsant medications:  none Current anti-CGRP:  none Current Vitamins/Herbal/Supplements:  B Current Antihistamines/Decongestants:  hydroxyzine Other therapy:  none Hormone/birth control:  LOESTRIN Other medications:  Adderall   Caffeine:  1 cup some mornings.  Tea daily Diet:  4-5 16oz bottles water daily.  Does not skip meals Exercise:  run Depression:  no; Anxiety:  no but work-related stress Other pain:  none Sleep hygiene:  good   HISTORY:  Onset:  Since highschool.  Progressively worse over the years..  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   Location:  right frontal Quality:  pounding/throbbing Intensity:  7/10.  She denies new headache, thunderclap headache or severe headache that wakes her from sleep. Aura:  absent Prodrome:  absent Associated symptoms:  nausea, sometimes vomiting, osmophobia, photophobia, phonophobia, sometimes right eye twitches.   She denies associated visual disturbance, unilateral numbness or weakness. Duration:  at least 4 hours. Frequency:  3 days a week Frequency of abortive medication: she was taking Motrin 3 days a week Triggers:  stress, perfumes Relieving factors:  nothing Activity:  aggravates       Past NSAIDS/analgesics:  BC powder, acetaminophen Past abortive triptans:  none Past abortive ergotamine:  none Past muscle relaxants:  none Past anti-emetic:  none Past antihypertensive medications:  none Past antidepressant medications:  duloxetine Past anticonvulsant medications:  none Past anti-CGRP:  none Past vitamins/Herbal/Supplements:  none Past antihistamines/decongestants:  Flonase Other past therapies:  none     Family history of headache:  mom (migraine)  PAST MEDICAL HISTORY: Past Medical History:  Diagnosis Date   ADHD (attention deficit hyperactivity disorder)    Anxiety    Depression    Pristique and lexapro in past   Migraine syndrome    stress is trigger   Miscarriage 2001    MEDICATIONS: Current Outpatient Medications on File Prior to Visit  Medication Sig Dispense Refill   amphetamine-dextroamphetamine (ADDERALL) 30 MG tablet Take 30 mg by mouth 2 (two) times daily.     b complex vitamins tablet Take 1 tablet by mouth daily.     buPROPion (WELLBUTRIN XL) 300 MG 24 hr tablet Take 300 mg by mouth at bedtime.     EPINEPHrine 0.3 mg/0.3 mL IJ SOAJ injection Inject 0.3 mg into the muscle as needed for anaphylaxis. 1 each 0   Erenumab-aooe (AIMOVIG) 140 MG/ML SOAJ Inject 140 mg into the skin every 28 (twenty-eight) days. 1.12 mL 5   Fremanezumab-vfrm (AJOVY) 225 MG/1.5ML SOAJ Inject 1 Pen into the skin every  28 (twenty-eight) days. 1.68 mL 3   norethindrone-ethinyl estradiol (LOESTRIN) 1-20 MG-MCG tablet Take 1 tablet by mouth daily.     ondansetron (ZOFRAN-ODT) 4 MG disintegrating tablet Take 1 tablet (4 mg total) by mouth every 8 (eight) hours as needed for nausea or  vomiting. 20 tablet 5   rizatriptan (MAXALT) 10 MG tablet Take 1 tablet (10 mg total) by mouth as needed for migraine. May repeat in 2 hours if needed 10 tablet 5   SUMAtriptan (IMITREX) 100 MG tablet Take 1 tablet (100 mg total) by mouth as needed for migraine. May repeat after 2 hours.  Maximum 2 tablets in 24 hours. 10 tablet 5   No current facility-administered medications on file prior to visit.    ALLERGIES: Allergies  Allergen Reactions   Eggs Or Egg-Derived Products Swelling    Tongue and lips swell   Penicillins Hives and Anaphylaxis    FAMILY HISTORY: Family History  Problem Relation Age of Onset   Lung cancer Father    Alzheimer's disease Maternal Grandfather    Alcohol abuse Paternal Grandfather    Alcohol abuse Paternal Aunt    Alcohol abuse Paternal Uncle       Objective:  *** General: No acute distress.  Patient appears ***-groomed.   Head:  Normocephalic/atraumatic Eyes:  Fundi examined but not visualized Neck: supple, no paraspinal tenderness, full range of motion Heart:  Regular rate and rhythm Lungs:  Clear to auscultation bilaterally Back: No paraspinal tenderness Neurological Exam: alert and oriented to person, place, and time.  Speech fluent and not dysarthric, language intact.  CN II-XII intact. Bulk and tone normal, muscle strength 5/5 throughout.  Sensation to light touch intact.  Deep tendon reflexes 2+ throughout, toes downgoing.  Finger to nose testing intact.  Gait normal, Romberg negative.   Shon Millet, DO  CC: ***

## 2022-06-30 ENCOUNTER — Encounter: Payer: Self-pay | Admitting: Neurology

## 2022-06-30 ENCOUNTER — Ambulatory Visit: Payer: BC Managed Care – PPO | Admitting: Neurology

## 2022-06-30 VITALS — BP 113/78 | HR 90 | Ht 69.0 in | Wt 163.9 lb

## 2022-06-30 DIAGNOSIS — G43009 Migraine without aura, not intractable, without status migrainosus: Secondary | ICD-10-CM

## 2022-06-30 MED ORDER — AJOVY 225 MG/1.5ML ~~LOC~~ SOAJ
1.0000 | SUBCUTANEOUS | 11 refills | Status: DC
Start: 2022-06-30 — End: 2022-11-01

## 2022-06-30 MED ORDER — SUMATRIPTAN SUCCINATE 100 MG PO TABS: 100.0000 mg | ORAL_TABLET | ORAL | 5 refills | Status: DC | PRN

## 2022-06-30 NOTE — Patient Instructions (Signed)
Ajovy every 28 days Sumatriptan and Zofran as needed

## 2022-08-17 ENCOUNTER — Encounter: Payer: Self-pay | Admitting: Neurology

## 2022-08-18 ENCOUNTER — Telehealth: Payer: Self-pay

## 2022-08-18 NOTE — Telephone Encounter (Addendum)
Patient seen in office, Patient to start [place].  PA Team please start a PA for ajovy 90 day supply

## 2022-08-30 NOTE — Telephone Encounter (Signed)
Patient Advocate Encounter   Received notification that prior authorization for AJOVY (fremanezumab-vfrm) injection 225MG/1.5ML auto-injectors is required.   PA submitted on 08/30/2022 Key [id number] Status is pending       Lyndel Safe, [place] Patient Advocate Specialist [place] Patient Advocate Team Direct Number: [phone]  Fax: [phone]

## 2022-09-09 ENCOUNTER — Other Ambulatory Visit: Payer: Self-pay | Admitting: Neurology

## 2022-09-09 MED ORDER — EMGALITY 120 MG/ML ~~LOC~~ SOAJ: 240.0000 mg | Freq: Once | SUBCUTANEOUS | 0 refills | Status: AC

## 2022-09-09 NOTE — Telephone Encounter (Signed)
Per Dr.Jaffe, The only other option is Emgality - the first dose requires 2 pens (2 injections) but then just one injection every 28 days thereafter.  I sent in a [facility] to [facility] [place].  Once she picks up that prescription, she should contact us and we will send in for the standing order of 1 pen every 28 days.  Another reason that there may be a problem is proof of pregnancy test.  This is a new requirement for some of the insurance companies before approving these medications.  Let's get a pregnancy test just in case.    Patient advised.    PA team please start a PA for Emgality.

## 2022-09-09 NOTE — Telephone Encounter (Signed)
Patient Advocate Encounter  Received notification that the request for prior authorization for AJOVY (fremanezumab-vfrm) injection '225MG'$ /1.5ML auto-injectors has been denied       Deborah Mcpherson, [place] Patient [place] Patient Advocate Team Direct Number: [phone]  Fax: [phone]

## 2022-09-14 ENCOUNTER — Telehealth: Payer: Self-pay

## 2022-09-14 NOTE — Telephone Encounter (Signed)
PA team are we able to see the letters for this patients.   Hey hope you're doing well. I received the denial letter from [place] for the [place]. It states it was denied because no one let them know I had fewer migraines when taking the Ajovy shot. Also it states that using the Ajovy injection I used fewer migraine rescue medications. So it says in this case it is unknown if member meets the requirements. It also states Dr. Tomi Likens would get a copy. Can you let me know if they will appeal this?

## 2022-09-20 NOTE — Telephone Encounter (Signed)
Submitted a Prior Authorization request to [facility] for  Ajovy  via CoverMyMeds. Will update once we receive a response.  Key: [id number]

## 2022-09-22 NOTE — Telephone Encounter (Signed)
Per Patient, Do you know if Ajovy could cause itching? Here lately I've been itching all over. I do know when I take the shot the next 2 days my leg is swollen, hot and itchy. Could I be itching from the shot? Thats the only thing that's changed.    Per Dr.Jaffe, The itching and leg swelling may indeed be a reaction to the shot.  We can change the medication to either another shot [facility]) or a daily pill Lenoria Chime).  However, they will need a prior auth too and won't know if they will approve it (which of course will take more time).  I recommend that she contact her insurance to see if the two above medications are formulary and let us know.  However, even though a medication is formulary, it may STILL require to go through the prior authorization process.    Per this mychart conversation, Patient to stop Ajovy and start Qulipta.   PA team please start a PA for Qulipta instead.

## 2022-09-27 ENCOUNTER — Other Ambulatory Visit (HOSPITAL_COMMUNITY): Payer: Self-pay

## 2022-09-27 ENCOUNTER — Other Ambulatory Visit: Payer: Self-pay

## 2022-09-27 MED ORDER — QULIPTA 60 MG PO TABS: 60.0000 mg | ORAL_TABLET | Freq: Every day | ORAL | 5 refills | Status: DC

## 2022-09-27 NOTE — Progress Notes (Addendum)
Per Dr.Jaffe, She can start the Qulipta right away.  If she activates and uses the copay card, she should be able to get 2 months of Qulipta regardless while we try to get the prior authorization.  We can also provide her some samples to make sure she is covered as well.

## 2022-09-27 NOTE — Telephone Encounter (Signed)
Patient Advocate Encounter   Received notification that prior authorization for Qulipta 60MG  tablets is required.   PA submitted on 09/27/2022 [place] [employer] Electronic Request Form Status is pending       Lyndel Safe, [place] Patient Advocate Specialist [place] Patient Advocate Team Direct Number: [phone]  Fax: [phone]

## 2022-09-28 NOTE — Telephone Encounter (Signed)
Patient Advocate Encounter  Prior Authorization for AJOVY 225MG  has been approved.    PA# [id number] Effective dates: 02.19.24 through 2.17.25

## 2022-09-29 ENCOUNTER — Telehealth: Payer: Self-pay | Admitting: Pharmacy Technician

## 2022-09-29 NOTE — Telephone Encounter (Signed)
Received a call from [facility], [place] was denied reasoning patient needs to try Emgality first.

## 2022-09-30 ENCOUNTER — Telehealth: Payer: Self-pay | Admitting: Pharmacy Technician

## 2022-09-30 NOTE — Telephone Encounter (Signed)
Received a fax regarding Prior Authorization from [facility] for [place]. Authorization has been DENIED because   Phone#[phone]

## 2022-10-01 NOTE — Telephone Encounter (Signed)
A PA request was sent to the PA team on 3/20.

## 2022-10-11 NOTE — Telephone Encounter (Signed)
Per dr.Jaffe, I would like to appeal stating that patient is unable to tolerate injections.  She has history of significant injection site reaction.

## 2022-10-25 NOTE — Telephone Encounter (Signed)
PA has been submitted via CMM for Emgality 120MG /ML auto-injectors (migraine) to [facility] and is pending additional questions/determination  Key: [id number]

## 2022-11-01 ENCOUNTER — Ambulatory Visit (INDEPENDENT_AMBULATORY_CARE_PROVIDER_SITE_OTHER): Payer: BC Managed Care – PPO | Admitting: Family Medicine

## 2022-11-01 ENCOUNTER — Encounter: Payer: Self-pay | Admitting: Family Medicine

## 2022-11-01 VITALS — BP 127/82 | HR 89 | Temp 98.2°F | Wt 168.2 lb

## 2022-11-01 DIAGNOSIS — J029 Acute pharyngitis, unspecified: Secondary | ICD-10-CM

## 2022-11-01 DIAGNOSIS — J020 Streptococcal pharyngitis: Secondary | ICD-10-CM

## 2022-11-01 LAB — POCT RAPID STREP A (OFFICE): Rapid Strep A Screen: NEGATIVE

## 2022-11-01 MED ORDER — AZITHROMYCIN 250 MG PO TABS: ORAL_TABLET | ORAL | 0 refills | Status: DC

## 2022-11-01 NOTE — Telephone Encounter (Signed)
LMOVM for patient PA approved.    Per patient do not feel comfortable taking any injection after the reaction she had with the Emgality.

## 2022-11-01 NOTE — Progress Notes (Signed)
Deborah Mcpherson , Oct 20, 1975, 47 y.o., female MRN: [id number] Patient Care Team    Relationship Specialty Notifications Start End  Natalia Leatherwood, DO PCP - General Family Medicine  03/18/16   Levi Aland, MD Consulting Physician Obstetrics and Gynecology  08/26/17   Milagros Evener, MD Consulting Physician Psychiatry  08/26/17    Comment: ADD, depression/anxiety  Drema Dallas, DO Consulting Physician Neurology  09/07/21   Sherlyn Lees DDS Referring Physician Dentistry  03/03/22     Chief Complaint  Patient presents with   Sore Throat    Started yesterday; does have white patch on right side of throat; recently in contact with strep     Subjective: Deborah Mcpherson is a 47 y.o. Pt presents for an OV with complaints of sore throat  of 2 days duration.  Associated symptoms include white patch on tonsil. She reports recent exposure with strep.  She denies fever of chills. She reports symptoms came on sudden and moderate to severe in nature.       11/03/2021   10:22 AM 09/22/2020    8:04 AM 09/21/2019    8:19 AM 09/12/2018    1:06 PM 08/26/2017   11:15 AM  Depression screen PHQ 2/9  Decreased Interest 0 0 0 0 0  Down, Depressed, Hopeless 0 0 0 0 0  PHQ - 2 Score 0 0 0 0 0  Altered sleeping    0 0  Tired, decreased energy    0 0  Change in appetite    0 0  Feeling bad or failure about yourself     0 0  Trouble concentrating    1 0  Moving slowly or fidgety/restless    0 0  Suicidal thoughts    0 0  PHQ-9 Score    1 0  Difficult doing work/chores    Not difficult at all Not difficult at all    Allergies  Allergen Reactions   Egg-Derived Products Swelling    Tongue and lips swell   Penicillins Hives and Anaphylaxis   Social History   Social History Narrative   Married, has 51 y/o daughter.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 No drugs.      Right handed   Past Medical History:   Diagnosis Date   ADHD (attention deficit hyperactivity disorder)    Anxiety    Depression    Pristique and lexapro in past   Migraine syndrome    stress is trigger   Miscarriage 2001   Past Surgical History:  Procedure Laterality Date   DILATION AND CURETTAGE OF UTERUS     miscarriage   OOPHORECTOMY Left    tube and ovary removed--all benign   OVARIAN CYST REMOVAL     x 3   Family History  Problem Relation Age of Onset   Lung cancer Father    Alzheimer's disease Maternal Grandfather    Alcohol abuse Paternal Grandfather    Alcohol abuse Paternal Aunt    Alcohol abuse Paternal Uncle    Allergies as of 11/01/2022       Reactions   Egg-derived Products Swelling   Tongue and lips swell   Penicillins Hives, Anaphylaxis        Medication List        Accurate as of November 01, 2022 11:24 AM. If you have any questions, ask your nurse or doctor.  STOP taking these medications    Ajovy 225 MG/1.5ML Soaj Generic drug: Fremanezumab-vfrm Stopped by: Felix Pacini, DO       TAKE these medications    amphetamine-dextroamphetamine 30 MG tablet Commonly known as: ADDERALL Take 30 mg by mouth 2 (two) times daily.   azithromycin 250 MG tablet Commonly known as: ZITHROMAX Take 2 tablets on day 1, then 1 tablet daily on days 2 through 5 Started by: Felix Pacini, DO   b complex vitamins tablet Take 1 tablet by mouth daily.   buPROPion 300 MG 24 hr tablet Commonly known as: WELLBUTRIN XL Take 300 mg by mouth at bedtime.   EPINEPHrine 0.3 mg/0.3 mL Soaj injection Commonly known as: EPI-PEN Inject 0.3 mg into the muscle as needed for anaphylaxis.   norethindrone-ethinyl estradiol 1-20 MG-MCG tablet Commonly known as: LOESTRIN Take 1 tablet by mouth daily.   ondansetron 4 MG disintegrating tablet Commonly known as: ZOFRAN-ODT Take 1 tablet (4 mg total) by mouth every 8 (eight) hours as needed for nausea or vomiting.   Qulipta 60 MG Tabs Generic drug:  Atogepant Take 1 tablet (60 mg total) by mouth daily at 2 PM.   SUMAtriptan 100 MG tablet Commonly known as: IMITREX Take 1 tablet (100 mg total) by mouth as needed for migraine. May repeat after 2 hours.  Maximum 2 tablets in 24 hours.        All past medical history, surgical history, allergies, family history, immunizations andmedications were updated in the EMR today and reviewed under the history and medication portions of their EMR.     Review of Systems  Constitutional:  Negative for chills, fever and malaise/fatigue.  HENT:  Positive for sore throat. Negative for congestion, ear discharge, ear pain and sinus pain.   Eyes: Negative.   Respiratory: Negative.    Cardiovascular: Negative.   Musculoskeletal: Negative.   Skin:  Negative for rash.  Neurological: Negative.    Negative, with the exception of above mentioned in HPI   Objective:  BP 127/82   Pulse 89   Temp 98.2 F (36.8 C)   Wt 168 lb 3.2 oz (76.3 kg)   SpO2 100%   BMI 24.84 kg/m  Body mass index is 24.84 kg/m. Physical Exam Vitals and nursing note reviewed.  Constitutional:      General: She is not in acute distress.    Appearance: Normal appearance. She is normal weight. She is not ill-appearing or toxic-appearing.  HENT:     Head: Normocephalic and atraumatic.     Right Ear: Tympanic membrane and ear canal normal.     Left Ear: Tympanic membrane and ear canal normal.     Nose: Rhinorrhea present. No congestion.     Mouth/Throat:     Mouth: Mucous membranes are moist.     Pharynx: Oropharyngeal exudate (right tonsil exudate) and posterior oropharyngeal erythema present.  Eyes:     General: No scleral icterus.       Right eye: No discharge.        Left eye: No discharge.     Extraocular Movements: Extraocular movements intact.     Conjunctiva/sclera: Conjunctivae normal.     Pupils: Pupils are equal, round, and reactive to light.  Cardiovascular:     Rate and Rhythm: Normal rate and regular  rhythm.  Pulmonary:     Effort: Pulmonary effort is normal.     Breath sounds: Normal breath sounds.  Musculoskeletal:     Cervical back: Neck supple. Tenderness present.  Lymphadenopathy:     Cervical: No cervical adenopathy.  Skin:    Findings: No rash.  Neurological:     Mental Status: She is alert and oriented to person, place, and time. Mental status is at baseline.     Motor: No weakness.     Coordination: Coordination normal.     Gait: Gait normal.  Psychiatric:        Mood and Affect: Mood normal.        Behavior: Behavior normal.        Thought Content: Thought content normal.        Judgment: Judgment normal.     No results found. No results found. Results for orders placed or performed in visit on 11/01/22 (from the past 24 hour(s))  POCT rapid strep A     Status: None   Collection Time: 11/01/22 11:17 AM  Result Value Ref Range   Rapid Strep A Screen Negative Negative    Assessment/Plan: Deborah Mcpherson is a 47 y.o. female present for OV for  Sore throat/Strep pharyngitis POCT testing is neg, however obvious white exudate present on right tonsil. Elect to treat for strep given exam and recent exposure.  - POCT rapid strep A>neg Rest, hydrate.  azith prescribed, take until completed.  F/U 2 weeks if not improved.     Reviewed expectations re: course of current medical issues. Discussed self-management of symptoms. Outlined signs and symptoms indicating need for more acute intervention. Patient verbalized understanding and all questions were answered. Patient received an After-Visit Summary.    Orders Placed This Encounter  Procedures   POCT rapid strep A   Meds ordered this encounter  Medications   azithromycin (ZITHROMAX) 250 MG tablet    Sig: Take 2 tablets on day 1, then 1 tablet daily on days 2 through 5    Dispense:  6 tablet    Refill:  0   Referral Orders  No referral(s) requested today     Note is dictated utilizing voice  recognition software. Although note has been proof read prior to signing, occasional typographical errors still can be missed. If any questions arise, please do not hesitate to call for verification.   electronically signed by:  Felix Pacini, DO  [facility] [place]

## 2022-11-01 NOTE — Telephone Encounter (Signed)
PA Approved

## 2022-11-02 ENCOUNTER — Encounter: Payer: Self-pay | Admitting: Family Medicine

## 2022-11-05 ENCOUNTER — Ambulatory Visit (INDEPENDENT_AMBULATORY_CARE_PROVIDER_SITE_OTHER): Payer: BC Managed Care – PPO | Admitting: Family Medicine

## 2022-11-05 ENCOUNTER — Encounter: Payer: Self-pay | Admitting: Family Medicine

## 2022-11-05 VITALS — BP 115/79 | HR 74 | Temp 98.0°F | Ht 68.0 in | Wt 171.2 lb

## 2022-11-05 DIAGNOSIS — Z131 Encounter for screening for diabetes mellitus: Secondary | ICD-10-CM | POA: Diagnosis not present

## 2022-11-05 DIAGNOSIS — Z13220 Encounter for screening for lipoid disorders: Secondary | ICD-10-CM

## 2022-11-05 DIAGNOSIS — Z79899 Other long term (current) drug therapy: Secondary | ICD-10-CM | POA: Diagnosis not present

## 2022-11-05 DIAGNOSIS — Z0000 Encounter for general adult medical examination without abnormal findings: Secondary | ICD-10-CM

## 2022-11-05 LAB — COMPREHENSIVE METABOLIC PANEL
ALT: 10 U/L (ref 0–35)
AST: 12 U/L (ref 0–37)
Albumin: 3.7 g/dL (ref 3.5–5.2)
Alkaline Phosphatase: 52 U/L (ref 39–117)
BUN: 13 mg/dL (ref 6–23)
CO2: 26 mEq/L (ref 19–32)
Calcium: 9 mg/dL (ref 8.4–10.5)
Chloride: 105 mEq/L (ref 96–112)
Creatinine, Ser: 0.88 mg/dL (ref 0.40–1.20)
GFR: 79.86 mL/min (ref >60.00)
Glucose, Bld: 89 mg/dL (ref 70–99)
Potassium: 4.2 mEq/L (ref 3.5–5.1)
Sodium: 137 mEq/L (ref 135–145)
Total Bilirubin: 0.2 mg/dL (ref 0.2–1.2)
Total Protein: 5.9 g/dL — ABNORMAL LOW (ref 6.0–8.3)

## 2022-11-05 LAB — CBC WITH DIFFERENTIAL/PLATELET
Basophils Absolute: 0.1 10*3/uL (ref 0.0–0.1)
Basophils Relative: 0.9 % (ref 0.0–3.0)
Eosinophils Absolute: 0.5 10*3/uL (ref 0.0–0.7)
Eosinophils Relative: 8.7 % — ABNORMAL HIGH (ref 0.0–5.0)
HCT: 37 % (ref 36.0–46.0)
Hemoglobin: 12.2 g/dL (ref 12.0–15.0)
Lymphocytes Relative: 31.6 % (ref 12.0–46.0)
Lymphs Abs: 1.9 10*3/uL (ref 0.7–4.0)
MCHC: 33 g/dL (ref 30.0–36.0)
MCV: 96.4 fl (ref 78.0–100.0)
Monocytes Absolute: 0.5 10*3/uL (ref 0.1–1.0)
Monocytes Relative: 7.6 % (ref 3.0–12.0)
Neutro Abs: 3.1 10*3/uL (ref 1.4–7.7)
Neutrophils Relative %: 51.2 % (ref 43.0–77.0)
Platelets: 282 10*3/uL (ref 150.0–400.0)
RBC: 3.84 Mil/uL — ABNORMAL LOW (ref 3.87–5.11)
RDW: 12.2 % (ref 11.5–15.5)
WBC: 6.1 10*3/uL (ref 4.0–10.5)

## 2022-11-05 LAB — HEMOGLOBIN A1C: Hgb A1c MFr Bld: 5.6 % (ref 4.6–6.5)

## 2022-11-05 LAB — TSH: TSH: 1.48 u[IU]/mL (ref 0.35–5.50)

## 2022-11-05 NOTE — Patient Instructions (Signed)
Return in about 1 year (around 11/06/2023) for cpe (20 min).        Great to see you today.  I have refilled the medication(s) we provide.   If labs were collected, we will inform you of lab results once received either by echart message or telephone call.   - echart message- for normal results that have been seen by the patient already.   - telephone call: abnormal results or if patient has not viewed results in their echart.

## 2022-11-05 NOTE — Progress Notes (Signed)
Patient ID: Deborah Mcpherson, female  DOB: July 25, 1975, 47 y.o.   MRN: [id number] Patient Care Team    Relationship Specialty Notifications Start End  Natalia Leatherwood, DO PCP - General Family Medicine  03/18/16   Levi Aland, MD Consulting Physician Obstetrics and Gynecology  08/26/17   Milagros Evener, MD Consulting Physician Psychiatry  08/26/17    Comment: ADD, depression/anxiety  Drema Dallas, DO Consulting Physician Neurology  09/07/21   Sherlyn Lees DDS Referring Physician Dentistry  03/03/22     Chief Complaint  Patient presents with   Annual Exam    Subjective: Deborah Mcpherson is a 47 y.o.  Female  present for CPE. All past medical history, surgical history, allergies, family history, immunizations, medications and social history were updated in the electronic medical record today. All recent labs, ED visits and hospitalizations within the last year were reviewed.  Health maintenance:  Colon cancer screening: No fhx, screen at 47-after October of this year. Mammogram: 05/2022- UTD at gyn.  Records requested Cervical cancer screening: last pap: 2022 (per pt), results: have been normal in the past. pt reports  completed by: Dr. Malva Limes.  Records requested Immunizations: tdap UTD 12/2014, Influenza not a candidate- allergy.  Infectious disease screening: HIV completed and Hep C completed DEXA: N/A Assistive device: none Oxygen WUJ:WJXB Patient has a Dental home. Hospitalizations/ED visits: reviewed      11/05/2022    8:06 AM 11/01/2022    1:26 PM 11/03/2021   10:22 AM 09/22/2020    8:04 AM 09/21/2019    8:19 AM  Depression screen PHQ 2/9  Decreased Interest 0 0 0 0 0  Down, Depressed, Hopeless 0 0 0 0 0  PHQ - 2 Score 0 0 0 0 0      09/12/2018    1:06 PM  GAD 7 : Generalized Anxiety Score  Nervous, Anxious, on Edge 0  Control/stop worrying 0  Worry too much - different things 0  Trouble relaxing 0  Restless 0  Easily annoyed or irritable 0  Afraid - awful  might happen 1  Total GAD 7 Score 1  Anxiety Difficulty Not difficult at all    Immunization History  Administered Date(s) Administered   PFIZER(Purple Top)SARS-COV-2 Vaccination 01/21/2020, 02/11/2020, 07/18/2020   Tdap 01/07/2015    Past Medical History:  Diagnosis Date   ADHD (attention deficit hyperactivity disorder)    Anxiety    Depression    Pristique and lexapro in past   Epistaxis 02/12/2021   Migraine syndrome    stress is trigger   Miscarriage 2001   Allergies  Allergen Reactions   Egg-Derived Products Swelling    Tongue and lips swell   Penicillins Hives and Anaphylaxis   Past Surgical History:  Procedure Laterality Date   DILATION AND CURETTAGE OF UTERUS     miscarriage   OOPHORECTOMY Left    tube and ovary removed--all benign   OVARIAN CYST REMOVAL     x 3   Family History  Problem Relation Age of Onset   Lung cancer Father    Alzheimer's disease Maternal Grandfather    Alcohol abuse Paternal Grandfather    Alcohol abuse Paternal Aunt    Alcohol abuse Paternal Uncle    Social History   Social History Narrative   Married, has 14 y/o daughter.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 No drugs.      Right  handed    Allergies as of 11/05/2022       Reactions   Egg-derived Products Swelling   Tongue and lips swell   Penicillins Hives, Anaphylaxis        Medication List        Accurate as of November 05, 2022  8:20 AM. If you have any questions, ask your nurse or doctor.          STOP taking these medications    azithromycin 250 MG tablet Commonly known as: ZITHROMAX Stopped by: Felix Pacini, DO       TAKE these medications    amphetamine-dextroamphetamine 30 MG tablet Commonly known as: ADDERALL Take 30 mg by mouth 2 (two) times daily.   b complex vitamins tablet Take 1 tablet by mouth daily.   buPROPion 300 MG 24 hr tablet Commonly known  as: WELLBUTRIN XL Take 300 mg by mouth at bedtime.   EPINEPHrine 0.3 mg/0.3 mL Soaj injection Commonly known as: EPI-PEN Inject 0.3 mg into the muscle as needed for anaphylaxis.   norethindrone-ethinyl estradiol 1-20 MG-MCG tablet Commonly known as: LOESTRIN Take 1 tablet by mouth daily.   ondansetron 4 MG disintegrating tablet Commonly known as: ZOFRAN-ODT Take 1 tablet (4 mg total) by mouth every 8 (eight) hours as needed for nausea or vomiting.   Qulipta 60 MG Tabs Generic drug: Atogepant Take 1 tablet (60 mg total) by mouth daily at 2 PM.   SUMAtriptan 100 MG tablet Commonly known as: IMITREX Take 1 tablet (100 mg total) by mouth as needed for migraine. May repeat after 2 hours.  Maximum 2 tablets in 24 hours.        All past medical history, surgical history, allergies, family history, immunizations andmedications were updated in the EMR today and reviewed under the history and medication portions of their EMR.     Recent Results (from the past 2160 hour(s))  POCT rapid strep A     Status: None   Collection Time: 11/01/22 11:17 AM  Result Value Ref Range   Rapid Strep A Screen Negative Negative    ROS 14 pt review of systems performed and negative (unless mentioned in an HPI)  Objective: BP 115/79   Pulse 74   Temp 98 F (36.7 C)   Ht 5\' 8"  (1.727 m)   Wt 171 lb 3.2 oz (77.7 kg)   SpO2 99%   BMI 26.03 kg/m  Physical Exam Vitals and nursing note reviewed.  Constitutional:      General: She is not in acute distress.    Appearance: Normal appearance. She is not ill-appearing or toxic-appearing.  HENT:     Head: Normocephalic and atraumatic.     Right Ear: Tympanic membrane, ear canal and external ear normal. There is no impacted cerumen.     Left Ear: Tympanic membrane, ear canal and external ear normal. There is no impacted cerumen.     Nose: No congestion or rhinorrhea.     Mouth/Throat:     Mouth: Mucous membranes are moist.     Pharynx: Oropharynx is  clear. No oropharyngeal exudate or posterior oropharyngeal erythema.     Comments: Erythema and swelling resolved from infection. X2 tonsil stones present right tonsil.  Eyes:     General: No scleral icterus.       Right eye: No discharge.        Left eye: No discharge.     Extraocular Movements: Extraocular movements intact.     Conjunctiva/sclera: Conjunctivae normal.  Pupils: Pupils are equal, round, and reactive to light.  Cardiovascular:     Rate and Rhythm: Normal rate and regular rhythm.     Pulses: Normal pulses.     Heart sounds: Normal heart sounds. No murmur heard.    No friction rub. No gallop.  Pulmonary:     Effort: Pulmonary effort is normal. No respiratory distress.     Breath sounds: Normal breath sounds. No stridor. No wheezing, rhonchi or rales.  Chest:     Chest wall: No tenderness.  Abdominal:     General: Abdomen is flat. Bowel sounds are normal. There is no distension.     Palpations: Abdomen is soft. There is no mass.     Tenderness: There is no abdominal tenderness. There is no right CVA tenderness, left CVA tenderness, guarding or rebound.     Hernia: No hernia is present.  Musculoskeletal:        General: No swelling, tenderness or deformity. Normal range of motion.     Cervical back: Normal range of motion and neck supple. No rigidity or tenderness.     Right lower leg: No edema.     Left lower leg: No edema.  Lymphadenopathy:     Cervical: No cervical adenopathy.  Skin:    General: Skin is warm and dry.     Coloration: Skin is not jaundiced or pale.     Findings: No bruising, erythema, lesion or rash.  Neurological:     General: No focal deficit present.     Mental Status: She is alert and oriented to person, place, and time. Mental status is at baseline.     Cranial Nerves: No cranial nerve deficit.     Sensory: No sensory deficit.     Motor: No weakness.     Coordination: Coordination normal.     Gait: Gait normal.     Deep Tendon Reflexes:  Reflexes normal.  Psychiatric:        Mood and Affect: Mood normal.        Behavior: Behavior normal.        Thought Content: Thought content normal.        Judgment: Judgment normal.       No results found.  Assessment/plan: Deborah Mcpherson is a 47 y.o. female present for CPE Routine general medical examination at a health care facility Patient was encouraged to exercise greater than 150 minutes a week. Patient was encouraged to choose a diet filled with fresh fruits and vegetables, and lean meats. AVS provided to patient today for education/recommendation on gender specific health and safety maintenance. Colon cancer screening: No fhx, screen at 47-after October of this year. Mammogram: 05/2022- UTD at gyn.  Records requested Cervical cancer screening: last pap: 2022 (per pt), results: have been normal in the past. pt reports  completed by: Dr. Malva Limes.  Records requested Immunizations: tdap UTD 12/2014, Influenza not a candidate- allergy.  Infectious disease screening: HIV completed and Hep C completed DEXA: N/A  Return in about 1 year (around 11/06/2023) for cpe (20 min).  Orders Placed This Encounter  Procedures   CBC with Differential/Platelet   Comprehensive metabolic panel   Hemoglobin A1c   TSH   No orders of the defined types were placed in this encounter.  Referral Orders  No referral(s) requested today   Electronically signed by: Felix Pacini, DO [facility] [place]

## 2022-11-08 ENCOUNTER — Encounter: Payer: Self-pay | Admitting: Neurology

## 2022-11-08 ENCOUNTER — Encounter: Payer: Self-pay | Admitting: Family Medicine

## 2022-11-17 ENCOUNTER — Telehealth: Payer: Self-pay | Admitting: Pharmacy Technician

## 2022-11-17 NOTE — Telephone Encounter (Signed)
Status of PA in separate encounter 

## 2022-11-17 NOTE — Telephone Encounter (Signed)
Patient Advocate Encounter   Received notification that prior authorization for Qulipta 60MG  tablets is required.   PA submitted on 11/17/2022 Key [id number] Insurance [employer] Commercial Electronic Request Form Status is pending

## 2022-11-23 ENCOUNTER — Other Ambulatory Visit (HOSPITAL_COMMUNITY): Payer: Self-pay

## 2022-11-23 NOTE — Telephone Encounter (Signed)
Patient Advocate Encounter  Prior Authorization for QULIPTA 60MG  has been approved with [employer].    PA# [id number] Effective dates: 05.10.24 through 7.31.24  Per [facility] test claim, copay for 30 days supply is $0 w/eVoucher

## 2022-11-24 ENCOUNTER — Ambulatory Visit (HOSPITAL_BASED_OUTPATIENT_CLINIC_OR_DEPARTMENT_OTHER)
Admission: RE | Admit: 2022-11-24 | Discharge: 2022-11-24 | Disposition: A | Discharge status: Home / Self Care | Payer: BC Managed Care – PPO | Source: Ambulatory Visit | Attending: Family Medicine | Admitting: Family Medicine

## 2022-11-24 ENCOUNTER — Ambulatory Visit (INDEPENDENT_AMBULATORY_CARE_PROVIDER_SITE_OTHER): Payer: BC Managed Care – PPO | Admitting: Family Medicine

## 2022-11-24 ENCOUNTER — Encounter: Payer: Self-pay | Admitting: Family Medicine

## 2022-11-24 VITALS — BP 122/81 | HR 78 | Temp 98.1°F | Wt 168.8 lb

## 2022-11-24 DIAGNOSIS — M546 Pain in thoracic spine: Secondary | ICD-10-CM

## 2022-11-24 MED ORDER — NAPROXEN 500 MG PO TABS
500.0000 mg | ORAL_TABLET | Freq: Two times a day (BID) | ORAL | 0 refills | Status: DC
Start: 2022-11-24 — End: 2023-10-31

## 2022-11-24 MED ORDER — METHOCARBAMOL 500 MG PO TABS: 500.0000 mg | ORAL_TABLET | Freq: Three times a day (TID) | ORAL | 0 refills | Status: DC | PRN

## 2022-11-24 MED ORDER — METHYLPREDNISOLONE ACETATE 80 MG/ML IJ SUSP
80.0000 mg | Freq: Once | INTRAMUSCULAR | Status: AC
Start: 2022-11-24 — End: 2022-11-24
  Administered 2022-11-24: 80 mg via INTRAMUSCULAR

## 2022-11-24 NOTE — Addendum Note (Signed)
Addended by: Filomena Jungling on: 11/24/2022 02:59 PM   Modules accepted: Orders

## 2022-11-24 NOTE — Patient Instructions (Signed)
We will call you with xray results and further plan

## 2022-11-24 NOTE — Progress Notes (Signed)
Deborah Mcpherson , 06-Apr-1976, 47 y.o., female MRN: [id number] Patient Care Team    Relationship Specialty Notifications Start End  Natalia Leatherwood, DO PCP - General Family Medicine  03/18/16   Levi Aland, MD Consulting Physician Obstetrics and Gynecology  08/26/17   Milagros Evener, MD Consulting Physician Psychiatry  08/26/17    Comment: ADD, depression/anxiety  Drema Dallas, DO Consulting Physician Neurology  09/07/21   Sherlyn Lees DDS Referring Physician Dentistry  03/03/22     Chief Complaint  Patient presents with   Back Pain    1 week     Subjective: Deborah Mcpherson is a 47 y.o. Pt presents for an OV with complaints of left-sided thoracic back pain of 1 week duration.  Associated symptoms include burning pain.  Symptoms are worsened when lying flat on this area and with range of motion.  She reports it will feel like there is something sharp and burning mid thoracic back through her sternum.  She denies chest pain. Pt has tried Aleve to ease their symptoms.  She has never had any back surgeries. She reports she did have an injury about a year ago where she felt like she tore a muscle in the same area.  She lifted a trailer up on the hitch and felt like something "ripped "in this location.  She does hear popping sensations at times.     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 No drugs.      Right handed   Past Medical History:  Diagnosis Date   ADHD  (attention deficit hyperactivity disorder)    Anxiety    Depression    Pristique and lexapro in past   Epistaxis 02/12/2021   Migraine syndrome    stress is trigger   Miscarriage 2001   Past Surgical History:  Procedure Laterality Date   DILATION AND CURETTAGE OF UTERUS     miscarriage   OOPHORECTOMY Left    tube and ovary removed--all benign   OVARIAN CYST REMOVAL     x 3   Family History  Problem Relation Age of Onset   Lung cancer Father    Alzheimer's disease Maternal Grandfather    Alcohol abuse Paternal Grandfather    Alcohol abuse Paternal Aunt    Alcohol abuse Paternal Uncle    Allergies as of 11/24/2022       Reactions   Egg-derived Products Swelling   Tongue and lips swell   Penicillins Hives, Anaphylaxis        Medication List        Accurate as of Nov 24, 2022  2:53 PM. If you have any questions, ask your nurse or doctor.          amphetamine-dextroamphetamine 30 MG tablet Commonly known as: ADDERALL Take 30 mg by mouth 2 (two) times  daily.   b complex vitamins tablet Take 1 tablet by mouth daily.   buPROPion 300 MG 24 hr tablet Commonly known as: WELLBUTRIN XL Take 300 mg by mouth at bedtime.   EPINEPHrine 0.3 mg/0.3 mL Soaj injection Commonly known as: EPI-PEN Inject 0.3 mg into the muscle as needed for anaphylaxis.   methocarbamol 500 MG tablet Commonly known as: ROBAXIN Take 1 tablet (500 mg total) by mouth every 8 (eight) hours as needed for muscle spasms. Started by: Felix Pacini, DO   naproxen 500 MG tablet Commonly known as: Naprosyn Take 1 tablet (500 mg total) by mouth 2 (two) times daily with a meal. Started by: Felix Pacini, DO   norethindrone-ethinyl estradiol 1-20 MG-MCG tablet Commonly known as: LOESTRIN Take 1 tablet by mouth daily.   ondansetron 4 MG disintegrating tablet Commonly known as: ZOFRAN-ODT Take 1 tablet (4 mg total) by mouth every 8 (eight) hours as needed for nausea or vomiting.   Qulipta 60 MG  Tabs Generic drug: Atogepant Take 1 tablet (60 mg total) by mouth daily at 2 PM.   SUMAtriptan 100 MG tablet Commonly known as: IMITREX Take 1 tablet (100 mg total) by mouth as needed for migraine. May repeat after 2 hours.  Maximum 2 tablets in 24 hours.        All past medical history, surgical history, allergies, family history, immunizations andmedications were updated in the EMR today and reviewed under the history and medication portions of their EMR.     ROS Negative, with the exception of above mentioned in HPI   Objective:  BP 122/81   Pulse 78   Temp 98.1 F (36.7 C)   Wt 168 lb 12.8 oz (76.6 kg)   SpO2 98%   BMI 25.67 kg/m  Body mass index is 25.67 kg/m. Physical Exam Vitals and nursing note reviewed.  Constitutional:      General: She is not in acute distress.    Appearance: Normal appearance. She is normal weight. She is not ill-appearing or toxic-appearing.  HENT:     Head: Normocephalic and atraumatic.  Eyes:     General: No scleral icterus.       Right eye: No discharge.        Left eye: No discharge.     Extraocular Movements: Extraocular movements intact.     Conjunctiva/sclera: Conjunctivae normal.     Pupils: Pupils are equal, round, and reactive to light.  Musculoskeletal:     Thoracic back: Spasms, tenderness and bony tenderness present. No swelling. Decreased range of motion.       Back:     Comments: Thoracic spine: no erythema or rash. No bruising. Poss rib sublx, TTP T9-11 over thoracic spine and rib/muscle group. Pain with all ROM.   Skin:    Findings: No rash.  Neurological:     Mental Status: She is alert and oriented to person, place, and time. Mental status is at baseline.     Motor: No weakness.     Coordination: Coordination normal.     Gait: Gait normal.  Psychiatric:        Mood and Affect: Mood normal.        Behavior: Behavior normal.        Thought Content: Thought content normal.        Judgment: Judgment normal.       No results found. No results found. No results found for this or any previous visit (from the past 24 hour(s)).  Assessment/Plan: Marchelle Folks  Deborah Mcpherson is a 47 y.o. female present for OV for  Acute left-sided thoracic back pain Possible musk strain, rib sublx or fracture vs thoracic spine etiology. Pain is present over left thoracic ribs just inferior to scapula with  mod-light touch. - DG Thoracic Spine W/Swimmers; Future IM depo medrol inj today Naproxen BID Robaxin tid Pt will be called with results of image and further plan discussed at that time.    Reviewed expectations re: course of current medical issues. Discussed self-management of symptoms. Outlined signs and symptoms indicating need for more acute intervention. Patient verbalized understanding and all questions were answered. Patient received an After-Visit Summary.    Orders Placed This Encounter  Procedures   DG Thoracic Spine W/Swimmers   Meds ordered this encounter  Medications   methocarbamol (ROBAXIN) 500 MG tablet    Sig: Take 1 tablet (500 mg total) by mouth every 8 (eight) hours as needed for muscle spasms.    Dispense:  90 tablet    Refill:  0   naproxen (NAPROSYN) 500 MG tablet    Sig: Take 1 tablet (500 mg total) by mouth 2 (two) times daily with a meal.    Dispense:  30 tablet    Refill:  0   Referral Orders  No referral(s) requested today     Note is dictated utilizing voice recognition software. Although note has been proof read prior to signing, occasional typographical errors still can be missed. If any questions arise, please do not hesitate to call for verification.   electronically signed by:  Felix Pacini, DO  [facility] [place]

## 2022-11-30 ENCOUNTER — Encounter: Payer: Self-pay | Admitting: Neurology

## 2022-11-30 ENCOUNTER — Encounter: Payer: Self-pay | Admitting: Family Medicine

## 2022-11-30 ENCOUNTER — Other Ambulatory Visit: Payer: Self-pay | Admitting: Family Medicine

## 2022-11-30 MED ORDER — GABAPENTIN 100 MG PO CAPS
100.0000 mg | ORAL_CAPSULE | Freq: Three times a day (TID) | ORAL | 3 refills | Status: DC
Start: 2022-11-30 — End: 2023-10-01

## 2022-12-30 NOTE — Progress Notes (Unsigned)
NEUROLOGY FOLLOW UP OFFICE NOTE  Deborah Mcpherson [id number]  Assessment/Plan:   Migraine without aura, without status migrainosus, not intractable - improved on Qulipta   Migraine prevention:  Qulipta 60mg  daily  Migraine rescue: sumatriptan 100mg , Continue Zofran as needed.  Limit use of pain relievers to no more than 2 days out of week to prevent risk of rebound or medication-overuse headache. 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       Subjective:  Deborah Mcpherson is a 47 year old female with ADHD, depression, anxiety who follows up for migraines.   UPDATE: Ajovy worked well but due to injection site reaction (itching), she was switched to [place] last month.  She is doing well.    Intensity:  severe Duration:  60 minutes with sumatriptan.   Frequency:  2 in last 4 weeks Frequency of abortive medication: 2 days in last 4 weeks Current NSAIDS/analgesics:  none Current triptans:  sumatriptan 100mg  Current ergotamine:  none Current anti-emetic:  Zofran ODT 4mg  Current muscle relaxants:  none Current Antihypertensive medications:  baseline low blood pressure Current Antidepressant medications:  bupropion XL 300mg  daily Current Anticonvulsant medications:  none Current anti-CGRP:  Qulipta 60mg  daily Current Vitamins/Herbal/Supplements:  B Current Antihistamines/Decongestants:  hydroxyzine Other therapy:  TMJ mouth guard Hormone/birth control:  LOESTRIN Other medications:  Adderall   Caffeine:  1 cup some mornings.  Tea daily Diet:  4-5 16oz bottles water daily.  Does not skip meals Exercise:  run Depression:  no; Anxiety:  no but work-related stress Other pain:  none Sleep hygiene:  good   HISTORY:  Onset:  Since highschool.  Progressively worse over the years..  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   Location:  right frontal Quality:   pounding/throbbing Intensity:  7/10.  She denies new headache, thunderclap headache or severe headache that wakes her from sleep. Aura:  absent Prodrome:  absent Associated symptoms:  nausea, sometimes vomiting, osmophobia, photophobia, phonophobia, sometimes right eye twitches.  She denies associated visual disturbance, unilateral numbness or weakness. Duration:  at least 4 hours. Frequency:  3 days a week Frequency of abortive medication: she was taking Motrin 3 days a week Triggers:  stress, perfumes Relieving factors:  nothing Activity:  aggravates       Past NSAIDS/analgesics:  BC powder, acetaminophen Past abortive triptans:  rizatriptan Past abortive ergotamine:  none Past muscle relaxants:  none Past anti-emetic:  none Past antihypertensive medications:  none Past antidepressant medications:  duloxetine Past anticonvulsant medications:  none Past anti-CGRP:  Ajovy (effective but caused itching) Past vitamins/Herbal/Supplements:  none Past antihistamines/decongestants:  Flonase Other past therapies:  none     Family history of headache:  mom (migraine)  PAST MEDICAL HISTORY: Past Medical History:  Diagnosis Date   ADHD (attention deficit hyperactivity disorder)    Anxiety    Depression    Pristique and lexapro in past   Epistaxis 02/12/2021   Migraine syndrome    stress is trigger   Miscarriage 2001    MEDICATIONS: Current Outpatient Medications on File Prior to Visit  Medication Sig Dispense Refill   amphetamine-dextroamphetamine (ADDERALL) 30 MG tablet Take 30 mg by mouth 2 (two) times daily.     Atogepant (QULIPTA) 60 MG TABS Take 1 tablet (60 mg total) by mouth daily at 2 PM. 30 tablet 5   b complex vitamins tablet Take 1 tablet by mouth daily.  buPROPion (WELLBUTRIN XL) 300 MG 24 hr tablet Take 300 mg by mouth at bedtime.     EPINEPHrine 0.3 mg/0.3 mL IJ SOAJ injection Inject 0.3 mg into the muscle as needed for anaphylaxis. 1 each 0   gabapentin  (NEURONTIN) 100 MG capsule Take 1 capsule (100 mg total) by mouth 3 (three) times daily. 90 capsule 3   methocarbamol (ROBAXIN) 500 MG tablet Take 1 tablet (500 mg total) by mouth every 8 (eight) hours as needed for muscle spasms. 90 tablet 0   naproxen (NAPROSYN) 500 MG tablet Take 1 tablet (500 mg total) by mouth 2 (two) times daily with a meal. 30 tablet 0   norethindrone-ethinyl estradiol (LOESTRIN) 1-20 MG-MCG tablet Take 1 tablet by mouth daily.     ondansetron (ZOFRAN-ODT) 4 MG disintegrating tablet Take 1 tablet (4 mg total) by mouth every 8 (eight) hours as needed for nausea or vomiting. 20 tablet 5   SUMAtriptan (IMITREX) 100 MG tablet Take 1 tablet (100 mg total) by mouth as needed for migraine. May repeat after 2 hours.  Maximum 2 tablets in 24 hours. 10 tablet 5   No current facility-administered medications on file prior to visit.    ALLERGIES: Allergies  Allergen Reactions   Egg-Derived Products Swelling    Tongue and lips swell   Penicillins Hives and Anaphylaxis    FAMILY HISTORY: Family History  Problem Relation Age of Onset   Lung cancer Father    Alzheimer's disease Maternal Grandfather    Alcohol abuse Paternal Grandfather    Alcohol abuse Paternal Aunt    Alcohol abuse Paternal Uncle       Objective:  Blood pressure 102/62. *** General: No acute distress.  Patient appears well-groomed.   Head:  Normocephalic/atraumatic Eyes:  Fundi examined but not visualized Neck: supple, no paraspinal tenderness, full range of motion Heart:  Regular rate and rhythm Lungs:  Clear to auscultation bilaterally Back: No paraspinal tenderness Neurological Exam: alert and oriented.  Speech fluent and not dysarthric, language intact.  CN II-XII intact. Bulk and tone normal, muscle strength 5/5 throughout.  Sensation to light touch intact.  Deep tendon reflexes 2+ throughout, toes downgoing.  Finger to nose testing intact.  Gait normal, Romberg negative.   Shon Millet, DO  CC:  Felix Pacini, DO

## 2023-01-03 ENCOUNTER — Encounter: Payer: Self-pay | Admitting: Neurology

## 2023-01-03 ENCOUNTER — Ambulatory Visit: Payer: BC Managed Care – PPO | Admitting: Neurology

## 2023-01-03 VITALS — BP 102/62 | HR 78 | Ht 68.0 in | Wt 168.0 lb

## 2023-01-03 DIAGNOSIS — G43009 Migraine without aura, not intractable, without status migrainosus: Secondary | ICD-10-CM | POA: Diagnosis not present

## 2023-01-03 NOTE — Patient Instructions (Signed)
Qulipta 60mg  daily Sumatriptan and zofran as needed.  Limit use of pain relievers to no more than 2 days out of week to prevent risk of rebound or medication-overuse headache. Keep headache diary Use mouth guard

## 2023-01-04 MED ORDER — QULIPTA 60 MG PO TABS: 60.0000 mg | ORAL_TABLET | Freq: Every day | ORAL | 5 refills | Status: DC

## 2023-01-04 MED ORDER — SUMATRIPTAN SUCCINATE 100 MG PO TABS: 100.0000 mg | ORAL_TABLET | ORAL | 5 refills | Status: DC | PRN

## 2023-01-06 ENCOUNTER — Ambulatory Visit: Payer: BC Managed Care – PPO | Admitting: Neurology

## 2023-01-27 ENCOUNTER — Telehealth: Payer: Self-pay

## 2023-01-27 ENCOUNTER — Encounter: Payer: Self-pay | Admitting: Neurology

## 2023-01-27 NOTE — Telephone Encounter (Signed)
Per Patient, I've went from 3-4 headaches a week to maybe 1-2 a month. Huge difference!!!

## 2023-01-27 NOTE — Telephone Encounter (Signed)
New PA needed for Qulipta per last encounter, 11/17/22,  Prior Authorization for QULIPTA 60MG  has been approved with [employer].     PA# [id number] Effective dates: 05.10.24 through 7.31.24

## 2023-01-31 ENCOUNTER — Other Ambulatory Visit (HOSPITAL_COMMUNITY): Payer: Self-pay

## 2023-02-01 ENCOUNTER — Telehealth: Payer: Self-pay | Admitting: Pharmacy Technician

## 2023-02-01 ENCOUNTER — Other Ambulatory Visit (HOSPITAL_COMMUNITY): Payer: Self-pay

## 2023-02-01 NOTE — Telephone Encounter (Signed)
Pharmacy Patient Advocate Encounter   Received notification from Pt Calls Messages that prior authorization for QULIPTA 60MG  is required/requested.   Insurance verification completed.   The patient is insured through [facility] .   Per test claim: PA submitted to [employer] via CoverMyMeds Key/confirmation #/EOC [id number] Status is pending

## 2023-02-01 NOTE — Telephone Encounter (Signed)
PA has been submitted, and telephone encounter has been created. 

## 2023-02-02 ENCOUNTER — Other Ambulatory Visit (HOSPITAL_COMMUNITY): Payer: Self-pay

## 2023-02-02 NOTE — Telephone Encounter (Signed)
Pharmacy Patient Advocate Encounter  Received notification from [facility] that Prior Authorization for QULIPTA 60MG  has been APPROVED from 7.23.24 to 7.23.25. Ran test claim, Copay is $0.  PA #/Case ID/Reference #: [id number]

## 2023-02-11 DIAGNOSIS — F4322 Adjustment disorder with anxiety: Secondary | ICD-10-CM | POA: Diagnosis not present

## 2023-02-11 DIAGNOSIS — F3342 Major depressive disorder, recurrent, in full remission: Secondary | ICD-10-CM | POA: Diagnosis not present

## 2023-02-11 DIAGNOSIS — F900 Attention-deficit hyperactivity disorder, predominantly inattentive type: Secondary | ICD-10-CM | POA: Diagnosis not present

## 2023-03-10 ENCOUNTER — Encounter: Payer: Self-pay | Admitting: Neurology

## 2023-07-05 NOTE — Progress Notes (Deleted)
NEUROLOGY FOLLOW UP OFFICE NOTE  Deborah Mcpherson [id number]  Assessment/Plan:   Migraine without aura, without status migrainosus, not intractable    Migraine prevention:  Qulipta 60mg  daily *** Migraine rescue: sumatriptan 100mg , Continue Zofran as needed. *** Limit use of pain relievers to no more than 2 Deborah out of week to prevent risk of rebound or medication-overuse headache. 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   UPDATE: ***  Intensity:  severe Duration:  60 minutes with sumatriptan.   Frequency:  2 in last 4 weeks Frequency of abortive medication: 2 Deborah in last 4 weeks Current NSAIDS/analgesics:  none Current triptans:  sumatriptan 100mg  Current ergotamine:  none Current anti-emetic:  Zofran ODT 4mg  Current muscle relaxants:  none Current Antihypertensive medications:  baseline low blood pressure Current Antidepressant medications:  bupropion XL 300mg  daily Current Anticonvulsant medications:  none Current anti-CGRP:  Qulipta 60mg  daily Current Vitamins/Herbal/Supplements:  B Current Antihistamines/Decongestants:  hydroxyzine Other therapy:  TMJ mouth guard Hormone/birth control:  LOESTRIN Other medications:  Adderall   Caffeine:  1 cup some mornings.  Tea daily Diet:  4-5 16oz bottles water daily.  Does not skip meals Exercise:  run Depression:  no; Anxiety:  no but work-related stress Other pain:  none Sleep hygiene:  good   HISTORY:  Onset:  Since highschool.  Progressively worse over the years..  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   Location:  right frontal Quality:  pounding/throbbing Intensity:  7/10.  She denies new headache, thunderclap headache or severe headache that wakes her from sleep. Aura:  absent Prodrome:   absent Associated symptoms:  nausea, sometimes vomiting, osmophobia, photophobia, phonophobia, sometimes right eye twitches.  She denies associated visual disturbance, unilateral numbness or weakness. Duration:  at least 4 hours. Frequency:  3 Deborah a week Frequency of abortive medication: she was taking Motrin 3 Deborah a week Triggers:  stress, perfumes Relieving factors:  nothing Activity:  aggravates       Past NSAIDS/analgesics:  BC powder, acetaminophen Past abortive triptans:  rizatriptan Past abortive ergotamine:  none Past muscle relaxants:  none Past anti-emetic:  none Past antihypertensive medications:  none Past antidepressant medications:  duloxetine Past anticonvulsant medications:  none Past anti-CGRP:  Ajovy (effective but caused itching) Past vitamins/Herbal/Supplements:  none Past antihistamines/decongestants:  Flonase Other past therapies:  none     Family history of headache:  mom (migraine)  PAST MEDICAL HISTORY: Past Medical History:  Diagnosis Date   ADHD (attention deficit hyperactivity disorder)    Anxiety    Depression    Pristique and lexapro in past   Epistaxis 02/12/2021   Migraine syndrome    stress is trigger   Miscarriage 2001    MEDICATIONS: Current Outpatient Medications on File Prior to Visit  Medication Sig Dispense Refill   amphetamine-dextroamphetamine (ADDERALL) 30 MG tablet Take 30 mg by mouth 2 (two) times daily.     Atogepant (QULIPTA) 60 MG TABS Take 1 tablet (60 mg total) by mouth daily at 2 PM. 30 tablet 5   b complex vitamins tablet Take 1 tablet by mouth daily.     buPROPion (WELLBUTRIN XL) 300 MG 24 hr tablet Take 300 mg by mouth at bedtime.     EPINEPHrine 0.3 mg/0.3  mL IJ SOAJ injection Inject 0.3 mg into the muscle as needed for anaphylaxis. 1 each 0   gabapentin (NEURONTIN) 100 MG capsule Take 1 capsule (100 mg total) by mouth 3 (three) times daily. 90 capsule 3   naproxen (NAPROSYN) 500 MG tablet Take 1 tablet (500 mg  total) by mouth 2 (two) times daily with a meal. 30 tablet 0   norethindrone-ethinyl estradiol (LOESTRIN) 1-20 MG-MCG tablet Take 1 tablet by mouth daily.     ondansetron (ZOFRAN-ODT) 4 MG disintegrating tablet Take 1 tablet (4 mg total) by mouth every 8 (eight) hours as needed for nausea or vomiting. 20 tablet 5   SUMAtriptan (IMITREX) 100 MG tablet Take 1 tablet (100 mg total) by mouth as needed for migraine. May repeat after 2 hours.  Maximum 2 tablets in 24 hours. 10 tablet 5   No current facility-administered medications on file prior to visit.    ALLERGIES: Allergies  Allergen Reactions   Egg-Derived Products Swelling    Tongue and lips swell   Penicillins Hives and Anaphylaxis    FAMILY HISTORY: Family History  Problem Relation Age of Onset   Lung cancer Father    Alzheimer's disease Maternal Grandfather    Alcohol abuse Paternal Grandfather    Alcohol abuse Paternal Aunt    Alcohol abuse Paternal Uncle       Objective:  *** General: No acute distress.  Patient appears well-groomed.   Head:  Normocephalic/atraumatic Eyes:  Fundi examined but not visualized Neck: supple, no paraspinal tenderness, full range of motion Heart:  Regular rate and rhythm Neurological Exam: ***   Shon Millet, DO  CC: Felix Pacini, DO

## 2023-07-08 ENCOUNTER — Ambulatory Visit: Payer: BC Managed Care – PPO | Admitting: Neurology

## 2023-07-08 ENCOUNTER — Encounter: Payer: Self-pay | Admitting: Neurology

## 2023-07-08 DIAGNOSIS — Z029 Encounter for administrative examinations, unspecified: Secondary | ICD-10-CM

## 2023-07-25 DIAGNOSIS — Z1231 Encounter for screening mammogram for malignant neoplasm of breast: Secondary | ICD-10-CM | POA: Diagnosis not present

## 2023-07-25 DIAGNOSIS — Z124 Encounter for screening for malignant neoplasm of cervix: Secondary | ICD-10-CM | POA: Diagnosis not present

## 2023-07-25 DIAGNOSIS — Z01419 Encounter for gynecological examination (general) (routine) without abnormal findings: Secondary | ICD-10-CM | POA: Diagnosis not present

## 2023-07-25 LAB — RESULTS CONSOLE HPV: CHL HPV: NEGATIVE

## 2023-07-25 LAB — HM MAMMOGRAPHY

## 2023-07-25 LAB — HM PAP SMEAR: HPV, high-risk: NEGATIVE

## 2023-08-09 DIAGNOSIS — F3342 Major depressive disorder, recurrent, in full remission: Secondary | ICD-10-CM | POA: Diagnosis not present

## 2023-08-09 DIAGNOSIS — F900 Attention-deficit hyperactivity disorder, predominantly inattentive type: Secondary | ICD-10-CM | POA: Diagnosis not present

## 2023-10-01 ENCOUNTER — Ambulatory Visit (INDEPENDENT_AMBULATORY_CARE_PROVIDER_SITE_OTHER)

## 2023-10-01 ENCOUNTER — Other Ambulatory Visit: Payer: Self-pay

## 2023-10-01 ENCOUNTER — Ambulatory Visit
Admission: EM | Admit: 2023-10-01 | Discharge: 2023-10-01 | Disposition: A | Discharge status: Home / Self Care | Attending: Family Medicine | Admitting: Family Medicine

## 2023-10-01 DIAGNOSIS — S99921A Unspecified injury of right foot, initial encounter: Secondary | ICD-10-CM | POA: Diagnosis not present

## 2023-10-01 DIAGNOSIS — M79671 Pain in right foot: Secondary | ICD-10-CM

## 2023-10-01 DIAGNOSIS — W2209XA Striking against other stationary object, initial encounter: Secondary | ICD-10-CM | POA: Diagnosis not present

## 2023-10-01 NOTE — Discharge Instructions (Addendum)
 Advised patient may RICE affected area of right foot for 30 minutes 3 times daily for the next 3 days.  Advised may take OTC Ibuprofen 600-800 mg 1-2 times daily, as needed for right foot pain.  Encouraged to increase daily water intake to 64 ounces per day when taking this medication.  Advised if symptoms worsen and/or unresolved please follow-up with your PCP or here for further evaluation.

## 2023-10-01 NOTE — ED Provider Notes (Signed)
 [facility]    CSN: [id number] Arrival date & time: 10/01/23  1518      History   Chief Complaint Chief Complaint  Patient presents with   Foot Injury    RT    HPI Deborah Mcpherson is a 48 y.o. female.   HPI 48 year old female presents with right foot pain that occurred 1 hour ago when she injured her right foot kicking a tree log she had just cut.  Reports pain is midfoot radiating up towards ankle.  Reports pain as 7 of 10.  PMH significant for migraine, ADHD, and bruxism.  Past Medical History:  Diagnosis Date   ADHD (attention deficit hyperactivity disorder)    Anxiety    Depression    Pristique and lexapro in past   Epistaxis 02/12/2021   Migraine syndrome    stress is trigger   Miscarriage 2001    Patient Active Problem List   Diagnosis Date Noted   Bruxism 02/12/2021   TMJ (temporomandibular joint syndrome) 02/12/2021   Encounter for long-term current use of medication 09/21/2019   Numbness and tingling of both upper extremities 09/21/2019   Tension headache 09/21/2019   Allergic reaction to egg 09/15/2018   Anxiety 08/26/2017   Attention deficit hyperactivity disorder (ADHD) 08/26/2017    Past Surgical History:  Procedure Laterality Date   DILATION AND CURETTAGE OF UTERUS     miscarriage   OOPHORECTOMY Left    tube and ovary removed--all benign   OVARIAN CYST REMOVAL     x 3    OB History     Gravida  2   Para  1   Term      Preterm      AB  1   Living  1      SAB      IAB      Ectopic      Multiple      Live Births               Home Medications    Prior to Admission medications   Medication Sig Start Date End Date Taking? Authorizing Provider  amphetamine-dextroamphetamine (ADDERALL) 30 MG tablet Take 30 mg by mouth 2 (two) times daily.    [provider]  Atogepant (QULIPTA) 60 MG TABS Take 1 tablet (60 mg total) by mouth daily at 2 PM. 01/04/23   Drema Dallas, DO  b complex vitamins tablet  Take 1 tablet by mouth daily.    [provider]  buPROPion (WELLBUTRIN XL) 300 MG 24 hr tablet Take 300 mg by mouth at bedtime. 04/25/20   [provider]  EPINEPHrine 0.3 mg/0.3 mL IJ SOAJ injection Inject 0.3 mg into the muscle as needed for anaphylaxis. 04/15/22   Kuneff, Renee A, DO  naproxen (NAPROSYN) 500 MG tablet Take 1 tablet (500 mg total) by mouth 2 (two) times daily with a meal. 11/24/22   Kuneff, Renee A, DO  norethindrone-ethinyl estradiol (LOESTRIN) 1-20 MG-MCG tablet Take 1 tablet by mouth daily.    [provider]  SUMAtriptan (IMITREX) 100 MG tablet Take 1 tablet (100 mg total) by mouth as needed for migraine. May repeat after 2 hours.  Maximum 2 tablets in 24 hours. 01/04/23   Drema Dallas, DO    Family History Family History  Problem Relation Age of Onset   Lung cancer Father    Alzheimer's disease Maternal Grandfather    Alcohol abuse Paternal Grandfather    Alcohol abuse  Paternal Aunt    Alcohol abuse Paternal Uncle     Social History Social History   Tobacco Use   Smoking status: Former    Current packs/day: 0.00    Types: Cigarettes    Quit date: 04/29/2009    Years since quitting: 14.4   Smokeless tobacco: Never  Vaping Use   Vaping status: Never Used  Substance Use Topics   Alcohol use: Yes    Alcohol/week: 0.0 standard drinks of alcohol   Drug use: No     Allergies   Egg-derived products and Penicillins   Review of Systems Review of Systems  Musculoskeletal:        Right ankle injury 1 hour ago     Physical Exam Triage Vital Signs ED Triage Vitals  Encounter Vitals Group     BP      Systolic BP Percentile      Diastolic BP Percentile      Pulse      Resp      Temp      Temp src      SpO2      Weight      Height      Head Circumference      Peak Flow      Pain Score      Pain Loc      Pain Education      Exclude from Growth Chart    No data found.  Updated Vital Signs BP 125/83 (BP Location:  Right Arm)   Pulse 90   Temp (!) 97.5 F (36.4 C) (Oral)   Resp 17   SpO2 97%     Physical Exam Vitals and nursing note reviewed.  Constitutional:      Appearance: Normal appearance. She is normal weight.  HENT:     Head: Normocephalic and atraumatic.     Mouth/Throat:     Mouth: Mucous membranes are moist.     Pharynx: Oropharynx is clear.  Eyes:     Extraocular Movements: Extraocular movements intact.     Conjunctiva/sclera: Conjunctivae normal.     Pupils: Pupils are equal, round, and reactive to light.  Cardiovascular:     Rate and Rhythm: Normal rate and regular rhythm.     Pulses: Normal pulses.     Heart sounds: Normal heart sounds.  Pulmonary:     Effort: Pulmonary effort is normal.     Breath sounds: Normal breath sounds. No wheezing, rhonchi or rales.  Musculoskeletal:        General: Normal range of motion.     Cervical back: Normal range of motion and neck supple.     Comments: Right foot (anterior aspect over proximal first metatarsal): TTP with mild to moderate soft tissue swelling noted  Skin:    General: Skin is warm and dry.  Neurological:     General: No focal deficit present.     Mental Status: She is alert and oriented to person, place, and time. Mental status is at baseline.  Psychiatric:        Mood and Affect: Mood normal.        Behavior: Behavior normal.        Thought Content: Thought content normal.      UC Treatments / Results  Labs (all labs ordered are listed, but only abnormal results are displayed) Labs Reviewed - No data to display  EKG   Radiology DG Foot Complete Right Result Date: 10/01/2023 CLINICAL DATA:  Mid foot  pain after kicking a tree. EXAM: RIGHT FOOT COMPLETE - 3+ VIEW COMPARISON:  None Available. FINDINGS: No acute fracture or dislocation. No significant soft tissue swelling. IMPRESSION: No acute osseous abnormality. Electronically Signed   By: Jeronimo Greaves M.D.   On: 10/01/2023 16:08    Procedures Procedures  (including critical care time)  Medications Ordered in UC Medications - No data to display  Initial Impression / Assessment and Plan / UC Course  I have reviewed the triage vital signs and the nursing notes.  Pertinent labs & imaging results that were available during my care of the patient were reviewed by me and considered in my medical decision making (see chart for details).     MDM: 1.  Right foot pain-right foot x-ray results revealed above; 2.  Injury of right foot, initial encounter-Advised patient may RICE affected area of right foot for 30 minutes 3 times daily for the next 3 days.  Advised may take OTC Ibuprofen 600-800 mg 1-2 times daily, as needed for right foot pain.  Encouraged to increase daily water intake to 64 ounces per day when taking this medication.  Advised if symptoms worsen and/or unresolved please follow-up with your PCP or here for further evaluation.  Patient discharged home, hemodynamically stable. Final Clinical Impressions(s) / UC Diagnoses   Final diagnoses:  Injury of right foot, initial encounter  Right foot pain     Discharge Instructions      Advised patient may RICE affected area of right foot for 30 minutes 3 times daily for the next 3 days.  Advised may take OTC Ibuprofen 600-800 mg 1-2 times daily, as needed for right foot pain.  Encouraged to increase daily water intake to 64 ounces per day when taking this medication.  Advised if symptoms worsen and/or unresolved please follow-up with your PCP or here for further evaluation.     ED Prescriptions   None    PDMP not reviewed this encounter.   Trevor Iha, FNP 10/01/23 [phone]

## 2023-10-01 NOTE — ED Triage Notes (Signed)
 Pt  c/o RT foot pain x 1 hour when she injured it kicking a tree log she just cut. Pain in mid foot radiating up toward ankle. Motrin 40 mins ago. Pain 7/10

## 2023-10-30 NOTE — Progress Notes (Unsigned)
 NEUROLOGY FOLLOW UP OFFICE NOTE  Deborah Mcpherson [id number]  Assessment/Plan:   Migraine without aura, without status migrainosus, not intractable - improved on Qulipta    Migraine prevention:  Qulipta  60mg  daily *** Migraine rescue: sumatriptan  100mg , Continue Zofran  as needed.  *** Limit use of pain relievers to no more than 2 days out of week to prevent risk of rebound or medication-overuse headache. Keep headache diary Use mouth guard for treatment of TMJ dysfunction Follow up ***       Subjective:  Deborah Mcpherson is a 48 year old female with ADHD, depression, anxiety who follows up for migraines.   UPDATE: ***  Intensity:  severe Duration:  60 minutes with sumatriptan .   Frequency:  2 in last 4 weeks Frequency of abortive medication: 2 days in last 4 weeks Current NSAIDS/analgesics:  none Current triptans:  sumatriptan  100mg  Current ergotamine:  none Current anti-emetic:  Zofran  ODT 4mg  Current muscle relaxants:  none Current Antihypertensive medications:  baseline low blood pressure Current Antidepressant medications:  bupropion XL 300mg  daily Current Anticonvulsant medications:  none Current anti-CGRP:  Qulipta  60mg  daily Current Vitamins/Herbal/Supplements:  B Current Antihistamines/Decongestants:  hydroxyzine  Other therapy:  TMJ mouth guard Hormone/birth control:  LOESTRIN Other medications:  Adderall   Caffeine:  1 cup some mornings.  Tea daily Diet:  4-5 16oz bottles water daily.  Does not skip meals Exercise:  run Depression:  no; Anxiety:  no but work-related stress Other pain:  none Sleep hygiene:  good   HISTORY:  Onset:  Since highschool.  Progressively worse over the years..  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   Location:  right frontal Quality:  pounding/throbbing Intensity:  7/10.  She denies new headache, thunderclap headache or severe headache that wakes her from sleep. Aura:   absent Prodrome:  absent Associated symptoms:  nausea, sometimes vomiting, osmophobia, photophobia, phonophobia, sometimes right eye twitches.  She denies associated visual disturbance, unilateral numbness or weakness. Duration:  at least 4 hours. Frequency:  3 days a week Frequency of abortive medication: she was taking Motrin 3 days a week Triggers:  stress, perfumes Relieving factors:  nothing Activity:  aggravates       Past NSAIDS/analgesics:  BC powder, acetaminophen Past abortive triptans:  rizatriptan  Past abortive ergotamine:  none Past muscle relaxants:  none Past anti-emetic:  none Past antihypertensive medications:  none Past antidepressant medications:  duloxetine Past anticonvulsant medications:  none Past anti-CGRP:  Ajovy  (effective but caused itching) Past vitamins/Herbal/Supplements:  none Past antihistamines/decongestants:  Flonase  Other past therapies:  none     Family history of headache:  mom (migraine)  PAST MEDICAL HISTORY: Past Medical History:  Diagnosis Date   ADHD (attention deficit hyperactivity disorder)    Anxiety    Depression    Pristique and lexapro in past   Epistaxis 02/12/2021   Migraine syndrome    stress is trigger   Miscarriage 2001    MEDICATIONS: Current Outpatient Medications on File Prior to Visit  Medication Sig Dispense Refill   amphetamine-dextroamphetamine (ADDERALL) 30 MG tablet Take 30 mg by mouth 2 (two) times daily.     Atogepant  (QULIPTA ) 60 MG TABS Take 1 tablet (60 mg total) by mouth daily at 2 PM. 30 tablet 5   b complex vitamins tablet Take 1 tablet by mouth daily.     buPROPion (WELLBUTRIN XL) 300 MG 24 hr tablet Take 300 mg by mouth at bedtime.  EPINEPHrine  0.3 mg/0.3 mL IJ SOAJ injection Inject 0.3 mg into the muscle as needed for anaphylaxis. 1 each 0   naproxen  (NAPROSYN ) 500 MG tablet Take 1 tablet (500 mg total) by mouth 2 (two) times daily with a meal. 30 tablet 0   norethindrone-ethinyl estradiol  (LOESTRIN) 1-20 MG-MCG tablet Take 1 tablet by mouth daily.     SUMAtriptan  (IMITREX ) 100 MG tablet Take 1 tablet (100 mg total) by mouth as needed for migraine. May repeat after 2 hours.  Maximum 2 tablets in 24 hours. 10 tablet 5   No current facility-administered medications on file prior to visit.    ALLERGIES: Allergies  Allergen Reactions   Egg-Derived Products Swelling    Tongue and lips swell   Penicillins Hives and Anaphylaxis    FAMILY HISTORY: Family History  Problem Relation Age of Onset   Lung cancer Father    Alzheimer's disease Maternal Grandfather    Alcohol abuse Paternal Grandfather    Alcohol abuse Paternal Aunt    Alcohol abuse Paternal Uncle       Objective:  *** General: No acute distress.  Patient appears well-groomed.   Head:  Normocephalic/atraumatic Eyes:  Fundi examined but not visualized Neck: supple, no paraspinal tenderness, full range of motion Heart:  Regular rate and rhythm Neurological Exam: alert and oriented.  Speech fluent and not dysarthric, language intact.  CN II-XII intact. Bulk and tone normal, muscle strength 5/5 throughout.  Sensation to light touch intact.  Deep tendon reflexes 2+ throughout.  Finger to nose testing intact.  Gait normal, Romberg negative.   Janne Members, DO  CC: Napolean Backbone, DO

## 2023-10-31 ENCOUNTER — Ambulatory Visit: Payer: BC Managed Care – PPO | Admitting: Neurology

## 2023-10-31 ENCOUNTER — Encounter: Payer: Self-pay | Admitting: Neurology

## 2023-10-31 VITALS — BP 124/80 | HR 90 | Ht 69.0 in | Wt 155.6 lb

## 2023-10-31 DIAGNOSIS — G43009 Migraine without aura, not intractable, without status migrainosus: Secondary | ICD-10-CM

## 2023-10-31 DIAGNOSIS — M5416 Radiculopathy, lumbar region: Secondary | ICD-10-CM

## 2023-10-31 NOTE — Patient Instructions (Addendum)
 Continue Qulipta  and sumatriptan  Refer to physical therapy for right lumbar radiculopathy Follow up 6 months.

## 2023-11-07 ENCOUNTER — Ambulatory Visit (INDEPENDENT_AMBULATORY_CARE_PROVIDER_SITE_OTHER): Payer: BC Managed Care – PPO | Admitting: Family Medicine

## 2023-11-07 ENCOUNTER — Encounter: Payer: Self-pay | Admitting: Family Medicine

## 2023-11-07 VITALS — BP 118/78 | HR 67 | Temp 98.0°F | Ht 68.0 in | Wt 153.0 lb

## 2023-11-07 DIAGNOSIS — Z1211 Encounter for screening for malignant neoplasm of colon: Secondary | ICD-10-CM | POA: Diagnosis not present

## 2023-11-07 DIAGNOSIS — Z131 Encounter for screening for diabetes mellitus: Secondary | ICD-10-CM

## 2023-11-07 DIAGNOSIS — Z79899 Other long term (current) drug therapy: Secondary | ICD-10-CM

## 2023-11-07 DIAGNOSIS — Z13220 Encounter for screening for lipoid disorders: Secondary | ICD-10-CM

## 2023-11-07 DIAGNOSIS — Z1231 Encounter for screening mammogram for malignant neoplasm of breast: Secondary | ICD-10-CM | POA: Diagnosis not present

## 2023-11-07 DIAGNOSIS — Z0000 Encounter for general adult medical examination without abnormal findings: Secondary | ICD-10-CM

## 2023-11-07 DIAGNOSIS — J358 Other chronic diseases of tonsils and adenoids: Secondary | ICD-10-CM

## 2023-11-07 LAB — LIPID PANEL
Cholesterol: 158 mg/dL (ref 0–200)
HDL: 60.8 mg/dL (ref >39.00)
LDL Cholesterol: 72 mg/dL (ref 0–99)
NonHDL: 97.34
Total CHOL/HDL Ratio: 3
Triglycerides: 126 mg/dL (ref 0.0–149.0)
VLDL: 25.2 mg/dL (ref 0.0–40.0)

## 2023-11-07 LAB — CBC
HCT: 40.5 % (ref 36.0–46.0)
Hemoglobin: 13.4 g/dL (ref 12.0–15.0)
MCHC: 33 g/dL (ref 30.0–36.0)
MCV: 95.5 fl (ref 78.0–100.0)
Platelets: 290 10*3/uL (ref 150.0–400.0)
RBC: 4.24 Mil/uL (ref 3.87–5.11)
RDW: 12.6 % (ref 11.5–15.5)
WBC: 7.2 10*3/uL (ref 4.0–10.5)

## 2023-11-07 LAB — TSH: TSH: 1.56 u[IU]/mL (ref 0.35–5.50)

## 2023-11-07 LAB — COMPREHENSIVE METABOLIC PANEL WITH GFR
ALT: 9 U/L (ref 0–35)
AST: 12 U/L (ref 0–37)
Albumin: 3.9 g/dL (ref 3.5–5.2)
Alkaline Phosphatase: 54 U/L (ref 39–117)
BUN: 14 mg/dL (ref 6–23)
CO2: 28 meq/L (ref 19–32)
Calcium: 9.1 mg/dL (ref 8.4–10.5)
Chloride: 104 meq/L (ref 96–112)
Creatinine, Ser: 1 mg/dL (ref 0.40–1.20)
GFR: 68.02 mL/min (ref >60.00)
Glucose, Bld: 91 mg/dL (ref 70–99)
Potassium: 4.5 meq/L (ref 3.5–5.1)
Sodium: 138 meq/L (ref 135–145)
Total Bilirubin: 0.5 mg/dL (ref 0.2–1.2)
Total Protein: 6 g/dL (ref 6.0–8.3)

## 2023-11-07 LAB — HEMOGLOBIN A1C: Hgb A1c MFr Bld: 5.5 % (ref 4.6–6.5)

## 2023-11-07 NOTE — Patient Instructions (Addendum)

## 2023-11-07 NOTE — Progress Notes (Signed)
 Patient ID: Deborah Mcpherson, female  DOB: 03/29/1976, 48 y.o.   MRN: [id number] Patient Care Team    Relationship Specialty Notifications Start End  Mariel Shope, DO PCP - General Family Medicine  03/18/16   Hamp Levine, MD Consulting Physician Obstetrics and Gynecology  08/26/17   Daphine Eagle, MD Consulting Physician Psychiatry  08/26/17    Comment: ADD, depression/anxiety  Merriam Abbey, DO Consulting Physician Neurology  09/07/21   Zadie Herter DDS Referring Physician Dentistry  03/03/22     Chief Complaint  Patient presents with   Annual Exam    Pt is fasting.     Subjective: Deborah Mcpherson is a 48 y.o.  Female  present for CPE.  All past medical history, surgical history, allergies, family history, immunizations, medications and social history were updated in the electronic medical record today. All recent labs, ED visits and hospitalizations within the last year were reviewed.  Health maintenance:  Colon cancer screening: No fhx, referred to GI for colonoscopy Mammogram: 05/2022- UTD at gyn.  Records requested (2nd request) Cervical cancer screening: last pap: 2022 (per pt), results: have been normal in the past. pt reports  completed by: Dr. Johnnette Nakayama.  Records requested (2nd request) Immunizations: tdap UTD 12/2014, Influenza not a candidate- allergy.  Infectious disease screening: HIV completed and Hep C completed DEXA: routine screen 55-60 yrs recommended.  Assistive device: none Oxygen WGN:FAOZ Patient has a Dental home. Hospitalizations/ED visits: Reviewed      11/07/2023    8:13 AM 11/05/2022    8:06 AM 11/01/2022    1:26 PM 11/03/2021   10:22 AM 09/22/2020    8:04 AM  Depression screen PHQ 2/9  Decreased Interest 0 0 0 0 0  Down, Depressed, Hopeless 0 0 0 0 0  PHQ - 2 Score 0 0 0 0 0      09/12/2018    1:06 PM  GAD 7 : Generalized Anxiety Score  Nervous, Anxious, on Edge 0  Control/stop worrying 0  Worry too much - different things 0   Trouble relaxing 0  Restless 0  Easily annoyed or irritable 0  Afraid - awful might happen 1  Total GAD 7 Score 1  Anxiety Difficulty Not difficult at all    Immunization History  Administered Date(s) Administered   PFIZER(Purple Top)SARS-COV-2 Vaccination 01/21/2020, 02/11/2020, 07/18/2020   Tdap 01/07/2015    Past Medical History:  Diagnosis Date   ADHD (attention deficit hyperactivity disorder)    Anxiety    Depression    Pristique and lexapro in past   Epistaxis 02/12/2021   Migraine syndrome    stress is trigger   Miscarriage 2001   Allergies  Allergen Reactions   Egg-Derived Products Swelling    Tongue and lips swell   Penicillins Hives and Anaphylaxis   Past Surgical History:  Procedure Laterality Date   DILATION AND CURETTAGE OF UTERUS     miscarriage   OOPHORECTOMY Left    tube and ovary removed--all benign   OVARIAN CYST REMOVAL     x 3   Family History  Problem Relation Age of Onset   Lung cancer Father    Alzheimer's disease Maternal Grandfather    Alcohol abuse Paternal Grandfather    Alcohol abuse Paternal Aunt    Alcohol abuse Paternal Uncle    Social History   Social History Narrative   Married, has 93 y/o daughter.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 No drugs.      Right handed    Allergies as of 11/07/2023       Reactions   Egg-derived Products Swelling   Tongue and lips swell   Penicillins Hives, Anaphylaxis        Medication List        Accurate as of November 07, 2023  8:19 AM. If you have any questions, ask your nurse or doctor.          amphetamine-dextroamphetamine 30 MG tablet Commonly known as: ADDERALL Take 30 mg by mouth 2 (two) times daily.   b complex vitamins tablet Take 1 tablet by mouth daily.   buPROPion 300 MG 24 hr tablet Commonly known as: WELLBUTRIN XL Take 300 mg by mouth at bedtime.   EPINEPHrine  0.3 mg/0.3  mL Soaj injection Commonly known as: EPI-PEN Inject 0.3 mg into the muscle as needed for anaphylaxis.   norethindrone-ethinyl estradiol 1-20 MG-MCG tablet Commonly known as: LOESTRIN Take 1 tablet by mouth daily.   Qulipta  60 MG Tabs Generic drug: Atogepant  Take 1 tablet (60 mg total) by mouth daily at 2 PM.   SUMAtriptan  100 MG tablet Commonly known as: IMITREX  Take 1 tablet (100 mg total) by mouth as needed for migraine. May repeat after 2 hours.  Maximum 2 tablets in 24 hours.        All past medical history, surgical history, allergies, family history, immunizations andmedications were updated in the EMR today and reviewed under the history and medication portions of their EMR.     No results found for this or any previous visit (from the past 2160 hours).   ROS 14 pt review of systems performed and negative (unless mentioned in an HPI)  Objective: BP 118/78   Pulse 67   Temp 98 F (36.7 C)   Ht 5\' 8"  (1.727 m)   Wt 153 lb (69.4 kg)   SpO2 99%   BMI 23.26 kg/m  Physical Exam Vitals and nursing note reviewed.  Constitutional:      General: She is not in acute distress.    Appearance: Normal appearance. She is not ill-appearing or toxic-appearing.  HENT:     Head: Normocephalic and atraumatic.     Right Ear: Tympanic membrane, ear canal and external ear normal. There is no impacted cerumen.     Left Ear: Tympanic membrane, ear canal and external ear normal. There is no impacted cerumen.     Nose: No congestion or rhinorrhea.     Mouth/Throat:     Mouth: Mucous membranes are moist.     Pharynx: Oropharynx is clear. No oropharyngeal exudate or posterior oropharyngeal erythema.  Eyes:     General: No scleral icterus.       Right eye: No discharge.        Left eye: No discharge.     Extraocular Movements: Extraocular movements intact.     Conjunctiva/sclera: Conjunctivae normal.     Pupils: Pupils are equal, round, and reactive to light.  Cardiovascular:      Rate and Rhythm: Normal rate and regular rhythm.     Pulses: Normal pulses.     Heart sounds: Normal heart sounds. No murmur heard.    No friction rub. No gallop.  Pulmonary:     Effort: Pulmonary effort is normal. No respiratory distress.     Breath sounds: Normal breath sounds. No stridor. No wheezing, rhonchi or rales.  Chest:     Chest  wall: No tenderness.  Abdominal:     General: Abdomen is flat. Bowel sounds are normal. There is no distension.     Palpations: Abdomen is soft. There is no mass.     Tenderness: There is no abdominal tenderness. There is no right CVA tenderness, left CVA tenderness, guarding or rebound.     Hernia: No hernia is present.  Musculoskeletal:        General: No swelling, tenderness or deformity. Normal range of motion.     Cervical back: Normal range of motion and neck supple. No rigidity or tenderness.     Right lower leg: No edema.     Left lower leg: No edema.  Lymphadenopathy:     Cervical: No cervical adenopathy.  Skin:    General: Skin is warm and dry.     Coloration: Skin is not jaundiced or pale.     Findings: No bruising, erythema, lesion or rash.  Neurological:     General: No focal deficit present.     Mental Status: She is alert and oriented to person, place, and time. Mental status is at baseline.     Cranial Nerves: No cranial nerve deficit.     Sensory: No sensory deficit.     Motor: No weakness.     Coordination: Coordination normal.     Gait: Gait normal.     Deep Tendon Reflexes: Reflexes normal.  Psychiatric:        Mood and Affect: Mood normal.        Behavior: Behavior normal.        Thought Content: Thought content normal.        Judgment: Judgment normal.       No results found.  Assessment/plan: IJANAE ZWIERS is a 48 y.o. female present for CPE Routine general medical examination at a health care facility Patient was encouraged to exercise greater than 150 minutes a week. Patient was encouraged to choose a diet  filled with fresh fruits and vegetables, and lean meats. AVS provided to patient today for education/recommendation on gender specific health and safety maintenance. Colon cancer screening: No fhx, referred to GI for colonoscopy Mammogram: 05/2022- UTD at gyn.  Records requested (2nd request) Cervical cancer screening: last pap: 2022 (per pt), results: have been normal in the past. pt reports  completed by: Dr. Johnnette Nakayama.  Records requested (2nd request) Immunizations: tdap UTD 12/2014, Influenza not a candidate- allergy.  Infectious disease screening: HIV completed and Hep C completed DEXA: routine screen 55-60 yrs recommended.   Return in about 1 year (around 11/07/2024) for cpe (20 min).  Orders Placed This Encounter  Procedures   CBC   Comprehensive metabolic panel with GFR   Hemoglobin A1c   Lipid panel   TSH   Ambulatory referral to Gastroenterology   Ambulatory referral to ENT   No orders of the defined types were placed in this encounter.  Referral Orders         Ambulatory referral to Gastroenterology         Ambulatory referral to ENT     Electronically signed by: Napolean Backbone, DO [facility]

## 2023-11-08 ENCOUNTER — Other Ambulatory Visit: Payer: Self-pay

## 2023-11-08 ENCOUNTER — Encounter: Payer: Self-pay | Admitting: Family Medicine

## 2023-11-08 ENCOUNTER — Ambulatory Visit: Attending: Neurology

## 2023-11-08 DIAGNOSIS — M5459 Other low back pain: Secondary | ICD-10-CM | POA: Diagnosis not present

## 2023-11-08 DIAGNOSIS — M6281 Muscle weakness (generalized): Secondary | ICD-10-CM | POA: Insufficient documentation

## 2023-11-08 DIAGNOSIS — M5416 Radiculopathy, lumbar region: Secondary | ICD-10-CM | POA: Insufficient documentation

## 2023-11-08 DIAGNOSIS — R252 Cramp and spasm: Secondary | ICD-10-CM | POA: Insufficient documentation

## 2023-11-08 DIAGNOSIS — R262 Difficulty in walking, not elsewhere classified: Secondary | ICD-10-CM | POA: Diagnosis not present

## 2023-11-08 NOTE — Therapy (Unsigned)
 OUTPATIENT PHYSICAL THERAPY THORACOLUMBAR EVALUATION   Patient Name: Deborah Mcpherson MRN: [id number] DOB:09-18-1975, 48 y.o., female Today's Date: 11/09/2023  END OF SESSION:  PT End of Session - 11/08/23 1414     Visit Number 1    Date for PT Re-Evaluation 01/03/24    Authorization Type [employer]    PT Start Time 1400    PT Stop Time 1445    PT Time Calculation (min) 45 min    Activity Tolerance Patient tolerated treatment well    Behavior During Therapy WFL for tasks assessed/performed             Past Medical History:  Diagnosis Date   ADHD (attention deficit hyperactivity disorder)    Anxiety    Depression    Pristique and lexapro in past   Epistaxis 02/12/2021   Migraine syndrome    stress is trigger   Miscarriage 2001   Past Surgical History:  Procedure Laterality Date   DILATION AND CURETTAGE OF UTERUS     miscarriage   OOPHORECTOMY Left    tube and ovary removed--all benign   OVARIAN CYST REMOVAL     x 3   Patient Active Problem List   Diagnosis Date Noted   Tonsil stone 11/07/2023   Bruxism 02/12/2021   TMJ (temporomandibular joint syndrome) 02/12/2021   Encounter for long-term current use of medication 09/21/2019   Numbness and tingling of both upper extremities 09/21/2019   Tension headache 09/21/2019   Allergic reaction to egg 09/15/2018   Anxiety 08/26/2017   Attention deficit hyperactivity disorder (ADHD) 08/26/2017    PCP:    Mariel Shope, DO   REFERRING PROVIDER: Merriam Abbey, DO  REFERRING DIAG: [phone] (ICD-10-CM) - Lumbar radiculopathy  Rationale for Evaluation and Treatment: Rehabilitation  THERAPY DIAG:  Other low back pain - Plan: PT plan of care cert/re-cert  Muscle weakness (generalized) - Plan: PT plan of care cert/re-cert  Difficulty in walking, not elsewhere classified - Plan: PT plan of care cert/re-cert  Cramp and spasm - Plan: PT plan of care cert/re-cert  ONSET DATE: 10/31/2023  SUBJECTIVE:                                                                                                                                                                                            SUBJECTIVE STATEMENT: Patient reports history of being healthy and no orthopedic issues other than a knee injury from falling off of a horse.  She admits she has been thrown from a horse many times but never had any serious injuries.  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  She does recall recently lifting a trailer tongue to put on a trailer hitch and she felt a sharp pain in the thoracic spine but this resolved.  She is having right low back and right leg pain.  She has been having her assistants do more of the lifting since her back has been hurting.  She hopes to resolve her pain and avoid any further intervention.    PERTINENT HISTORY:  na  PAIN:  Are you having pain? Yes: NPRS scale: 4/10 Pain location: low back and right leg Pain description: aching Aggravating factors: lifting, bending, riding in a car Relieving factors: moving, walking  PRECAUTIONS: None  RED FLAGS: None   WEIGHT BEARING RESTRICTIONS: No  FALLS:  Has patient fallen in last 6 months? No  LIVING ENVIRONMENT: Lives with: lives with their family and lives with their spouse Lives in: House/apartment  OCCUPATION: Farming  PLOF: Independent, Independent with basic ADLs, Independent with household mobility without device, Independent with community mobility without device, Independent with homemaking with ambulation, Independent with gait, and Independent with transfers  PATIENT GOALS: She hopes to resolve her pain and avoid any further intervention.  NEXT MD VISIT: prn  OBJECTIVE:  Note: Objective measures were completed at Evaluation unless otherwise noted.  DIAGNOSTIC FINDINGS:  na  PATIENT SURVEYS:  Modified Oswestry 7/50= 14%   COGNITION: Overall cognitive status: Within functional limits for tasks  assessed     SENSATION: Intermittent radicular symptoms L4-L5 dermatome  MUSCLE LENGTH: Hamstrings: Right 60 deg; Left 70 deg Thomas test: Right pos; Left pos  POSTURE: No Significant postural limitations  PALPATION: Symmetrical alignment t/o kinetic chain  LUMBAR ROM:   AROM eval  Flexion WNL  Extension WNL  Right lateral flexion WNL  Left lateral flexion WNL  Right rotation WNL  Left rotation WNL   (Blank rows = not tested)  LOWER EXTREMITY ROM:     WNL  LOWER EXTREMITY MMT:    Generally 5/5 throughout bilateral LE's  LUMBAR SPECIAL TESTS:  Straight leg raise test: Negative  GAIT: Distance walked: 30 feet Assistive device utilized: None Level of assistance: Complete Independence Comments: no gait abnormalities with exception of start up pain  TREATMENT DATE: 11/08/23 Initial eval completed, educated on spinal anatomy, proper posture and body mechanics and how to make a lumbar roll for her car and for home, and issued HEP.                                                                                                                                   PATIENT EDUCATION:  Education details: See above Person educated: Patient Education method: Explanation, Demonstration, Verbal cues, and Handouts Education comprehension: verbalized understanding, returned demonstration, and verbal cues required  HOME EXERCISE PROGRAM: Access Code: [email or web address] URL: [email or web address] Date: 11/08/2023 Prepared by: Aletha Anderson  Exercises - Lying Prone  - 1 x daily - 7 x weekly - 1 sets - 1  reps - 2 min hold - Static Prone on Elbows  - 1 x daily - 7 x weekly - 1 sets - 1 reps - 2 min hold - Prone Push Ups on Forearms  - 1 x daily - 7 x weekly - 1 sets - 10 reps - Standing Hamstring Stretch on Chair  - 1 x daily - 7 x weekly - 1 sets - 3 reps - 30 sec hold - Quadricep Stretch with Chair and Counter Support  - 1 x daily - 7 x weekly - 1 sets - 3 reps - 30 sec  hold - Seated Figure 4 Piriformis Stretch  - 1 x daily - 7 x weekly - 1 sets - 3 reps - 30 sed hold  ASSESSMENT:  CLINICAL IMPRESSION: Patient is a 48 y.o. female who was seen today for physical therapy evaluation and treatment for low back pain with radiculopathy.  She presents with full lumbar ROM and LE strength but tight hamstrings, quads/hip flexors and hip rotators.  She has increased radicular symptoms with sitting.  Symptoms centralize with extension positioning.  Her symptoms are consistent with lumbar disc bulge or HNP.  She would respond well to LE flexibility, core strengthening and modalities for pain relief.    OBJECTIVE IMPAIRMENTS: difficulty walking, decreased ROM, decreased strength, increased fascial restrictions, increased muscle spasms, impaired flexibility, impaired sensation, improper body mechanics, postural dysfunction, and pain.   ACTIVITY LIMITATIONS: carrying, lifting, bending, sitting, standing, squatting, sleeping, transfers, bed mobility, bathing, and dressing  PARTICIPATION LIMITATIONS: meal prep, cleaning, laundry, shopping, community activity, occupation, and yard work  PERSONAL FACTORS: Time since onset of injury/illness/exacerbation are also affecting patient's functional outcome.   REHAB POTENTIAL: Excellent  CLINICAL DECISION MAKING: Stable/uncomplicated  EVALUATION COMPLEXITY: Low   GOALS: Goals reviewed with patient? Yes  SHORT TERM GOALS: Target date: 12/06/2023  Pain report to be no greater than 4/10  Baseline: Goal status: INITIAL  2.  Patient will be independent with initial HEP  Baseline:  Goal status: INITIAL   LONG TERM GOALS: Target date: 01/04/2024  Patient to report pain no greater than 2/10  Baseline:  Goal status: INITIAL  2.  Patient to be independent with advanced HEP  Baseline:  Goal status: INITIAL  3.  Radicular symptoms to centralize Baseline:  Goal status: INITIAL  4.  Patient to avoid ESI or any other  invasive procedures Baseline:  Goal status: INITIAL  5.  Patient to be able to resume full duties on the farm Baseline:  Goal status: INITIAL  6.  Oswestry to be 8% or below Baseline:  Goal status: INITIAL  PLAN:  PT FREQUENCY: 1-2x/week  PT DURATION: 8 weeks  PLANNED INTERVENTIONS: 97110-Therapeutic exercises, 97530- Therapeutic activity, [id number]- Neuromuscular re-education, 97535- Self Care, 16109- Manual therapy, [id number]- Gait training, [phone]- Aquatic Therapy, [phone]- Electrical stimulation (unattended), [phone]- Electrical stimulation (manual), [id number]- Ultrasound, [id number]- Traction (mechanical), [id number]- Ionotophoresis 4mg /ml Dexamethasone, Patient/Family education, Taping, Dry Needling, Joint mobilization, Spinal mobilization, Cryotherapy, and Moist heat.  PLAN FOR NEXT SESSION: Assess response to initial HEP, add in core work, review HEP if needed   [place] B. Devondre Guzzetta, PT 11/09/23 2:47 PM [facility] [address], [address] [place], [place] [address] Phone # [phone] Fax [phone]

## 2023-11-14 ENCOUNTER — Ambulatory Visit

## 2023-11-21 ENCOUNTER — Ambulatory Visit

## 2023-11-24 ENCOUNTER — Encounter: Payer: Self-pay | Admitting: Family Medicine

## 2023-11-28 ENCOUNTER — Encounter

## 2023-12-06 ENCOUNTER — Encounter

## 2023-12-12 ENCOUNTER — Encounter

## 2023-12-19 ENCOUNTER — Encounter

## 2024-01-29 ENCOUNTER — Other Ambulatory Visit: Payer: Self-pay | Admitting: Neurology

## 2024-01-30 ENCOUNTER — Encounter: Payer: Self-pay | Admitting: Family Medicine

## 2024-01-30 ENCOUNTER — Ambulatory Visit (INDEPENDENT_AMBULATORY_CARE_PROVIDER_SITE_OTHER): Admitting: Family Medicine

## 2024-01-30 ENCOUNTER — Ambulatory Visit: Payer: Self-pay

## 2024-01-30 VITALS — BP 116/80 | HR 93 | Temp 98.1°F | Wt 150.0 lb

## 2024-01-30 DIAGNOSIS — F3342 Major depressive disorder, recurrent, in full remission: Secondary | ICD-10-CM | POA: Diagnosis not present

## 2024-01-30 DIAGNOSIS — R319 Hematuria, unspecified: Secondary | ICD-10-CM | POA: Diagnosis not present

## 2024-01-30 DIAGNOSIS — F4322 Adjustment disorder with anxiety: Secondary | ICD-10-CM | POA: Diagnosis not present

## 2024-01-30 DIAGNOSIS — F900 Attention-deficit hyperactivity disorder, predominantly inattentive type: Secondary | ICD-10-CM | POA: Diagnosis not present

## 2024-01-30 DIAGNOSIS — R109 Unspecified abdominal pain: Secondary | ICD-10-CM

## 2024-01-30 LAB — POC URINALSYSI DIPSTICK (AUTOMATED)
Bilirubin, UA: NEGATIVE
Glucose, UA: NEGATIVE
Ketones, UA: NEGATIVE
Leukocytes, UA: NEGATIVE
Nitrite, UA: NEGATIVE
Protein, UA: POSITIVE — AB
Spec Grav, UA: >=1.03 — AB (ref 1.010–1.025)
Urobilinogen, UA: NEGATIVE U/dL — AB
pH, UA: 6 (ref 5.0–8.0)

## 2024-01-30 MED ORDER — TAMSULOSIN HCL 0.4 MG PO CAPS
0.4000 mg | ORAL_CAPSULE | Freq: Every day | ORAL | 3 refills | Status: DC
Start: 2024-01-30 — End: 2024-02-17

## 2024-01-30 MED ORDER — SULFAMETHOXAZOLE-TRIMETHOPRIM 800-160 MG PO TABS: 1.0000 | ORAL_TABLET | Freq: Two times a day (BID) | ORAL | 0 refills | Status: DC

## 2024-01-30 NOTE — Patient Instructions (Addendum)
 Return in about 2 weeks (around 02/13/2024), or if symptoms worsen or fail to improve.  Strain urine.  Once we get results back, we will call you with plan       Great to see you today.  I have refilled the medication(s) we provide.   If labs were collected or images ordered, we will inform you of  results once we have received them and reviewed. We will contact you either by echart message, or telephone call.  Please give ample time to the testing facility, and our office to run,  receive and review results. Please do not call inquiring of results, even if you can see them in your chart. We will contact you as soon as we are able. If it has been over 1 week since the test was completed, and you have not yet heard from us , then please call us .    - echart message- for normal results that have been seen by the patient already.   - telephone call: abnormal results or if patient has not viewed results in their echart.  If a referral to a specialist was entered for you, please call us  in 2 weeks if you have not heard from the specialist office to schedule.

## 2024-01-30 NOTE — Telephone Encounter (Signed)
 FYI Only or Action Required?: FYI only for provider.  Patient was last seen in primary care on 11/07/2023 by Catherine Fuller A, DO.  Called Nurse Triage reporting Hematuria, tenderness at lower abdominal-pelvic area with palpation, and some burning with urination.  Symptoms began yesterday.  Interventions attempted: Nothing.  Symptoms are: continuing, blood not present in urine so far today.  Triage Disposition: See Physician Within 24 Hours  Patient/caregiver understands and will follow disposition?: Yes      Copied from CRM [id number]. Topic: Clinical - Red Word Triage >> Jan 30, 2024  1:14 PM Suzen RAMAN wrote: Red Word that prompted transfer to Nurse Triage: blood in urine Reason for Disposition  Pain or burning with passing urine  Answer Assessment - Initial Assessment Questions 1. COLOR of URINE: Describe the color of the urine.  (e.g., tea-colored, pink, red, bloody) Do you have blood clots in your urine? (e.g., none, pea, grape, small coin)     Urine is like a light yellow then at the end it drips blood, can tell right when finishes peeing turns darker than blood on end, wipe to 2-3 dots blood on tissue 2. ONSET: When did the bleeding start?      Happened last night 2 different times that went and peed, not at all this morning 3. EPISODES: How many times has there been blood in the urine? or How many times today?     2x total so far, none today so far 4. PAIN with URINATION: Is there any pain with passing your urine? If Yes, ask: How bad is the pain?  (Scale 1-10; or mild, moderate, severe)     No but kind of feels raw a little bit, doesn't burn extremely but not normal 5. FEVER: Do you have a fever? If Yes, ask: What is your temperature, how was it measured, and when did it start?     no 6. ASSOCIATED SYMPTOMS: Are you passing urine more frequently than usual?     yes 7. OTHER SYMPTOMS: Do you have any other symptoms? (e.g., back/flank pain, abdomen  pain, vomiting)     Sciatic nerve problems but not sure, no abdominal pain but kind of feels like when press right near uterus area feels kinda tender 8. PREGNANCY: Is there any chance you are pregnant? When was your last menstrual period?     No  blood in urine, drops of blood at end of urinating, some burning, tenderness to lower abdominal-pelvic area with palpation   Advised pt be examined asap today, advised ED if pure blood streaming, scheduled with PCP in 1 hour from now. Advised call back if any worsening or new symptoms.  Protocols used: Urine - Blood In-A-AH

## 2024-01-30 NOTE — Telephone Encounter (Signed)
 No further action needed.

## 2024-01-30 NOTE — Progress Notes (Signed)
 Deborah Mcpherson , 01/09/1976, 48 y.o., female MRN: [id number] Patient Care Team    Relationship Specialty Notifications Start End  Catherine Charlies LABOR, DO PCP - General Family Medicine  03/18/16   Lenon Oneil BRAVO, MD Consulting Physician Obstetrics and Gynecology  08/26/17   Vincente Grip, MD Consulting Physician Psychiatry  08/26/17    Comment: ADD, depression/anxiety  Skeet Juliene SAUNDERS, DO Consulting Physician Neurology  09/07/21   Christyne Finder DDS Referring Physician Dentistry  03/03/22     Chief Complaint  Patient presents with   Dysuria    Started yesterday; blood in urine, burning/pain.     Subjective: Deborah Mcpherson is a 48 y.o. Pt presents for an OV with complaints of hematuria of 2 days duration.  Associated symptoms include dysuria. Patient reports last week she has noticed needing to more frequently directed bathroom and feeling like she was not drinking her bladder all the way. Over the last 2 days she has noticed blood on her toilet tissue after wiping with urination.  She reports noting some stringy appearing blood in the toilet with her urine.  She is on the birth control pill and it is not time for her menses. Patient denies fever, chills, nausea.  She does endorse mild lower back pain she contributed to her sciatica.  She does endorse mild suprapubic tenderness. She has no prior history of kidney stones.  She reports she does not drink soda, but she does drink a lot of milk.     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 No drugs.      Right handed   Past Medical History:  Diagnosis Date   ADHD (attention deficit hyperactivity disorder)    Anxiety    Depression    Pristique and lexapro in past   Epistaxis 02/12/2021   Migraine syndrome    stress is trigger   Miscarriage 2001   Past Surgical History:  Procedure Laterality Date   DILATION AND CURETTAGE OF UTERUS     miscarriage   OOPHORECTOMY Left    tube and ovary removed--all benign   OVARIAN CYST REMOVAL     x 3   Family History  Problem Relation Age of Onset   Lung cancer Father    Alzheimer's disease Maternal Grandfather    Alcohol abuse Paternal Grandfather    Alcohol abuse Paternal Aunt    Alcohol abuse Paternal Uncle    Allergies as of 01/30/2024       Reactions   Egg-derived Products Swelling   Tongue and lips swell   Penicillins Hives, Anaphylaxis        Medication List        Accurate as of January 30, 2024  2:54 PM. If you have any questions, ask your nurse or doctor.  amphetamine-dextroamphetamine 30 MG tablet Commonly known as: ADDERALL Take 30 mg by mouth 2 (two) times daily.   b complex vitamins tablet Take 1 tablet by mouth daily.   buPROPion 300 MG 24 hr tablet Commonly known as: WELLBUTRIN XL Take 300 mg by mouth at bedtime.   EPINEPHrine  0.3 mg/0.3 mL Soaj injection Commonly known as: EPI-PEN Inject 0.3 mg into the muscle as needed for anaphylaxis.   norethindrone-ethinyl estradiol 1-20 MG-MCG tablet Commonly known as: LOESTRIN Take 1 tablet by mouth daily.   Qulipta  60 MG Tabs Generic drug: Atogepant  Take 1 tablet (60 mg total) by mouth daily at 2 PM.   sulfamethoxazole -trimethoprim  800-160 MG tablet Commonly known as: BACTRIM  DS Take 1 tablet by mouth 2 (two) times daily. Started by: Gal Feldhaus   SUMAtriptan  100 MG tablet Commonly known as: IMITREX  Take 1 tablet (100 mg total) by mouth as needed for migraine.  May repeat after 2 hours.  Maximum 2 tablets in 24 hours.   tamsulosin  0.4 MG Caps capsule Commonly known as: FLOMAX  Take 1 capsule (0.4 mg total) by mouth daily. Started by: Charlies Bellini        All past medical history, surgical history, allergies, family history, immunizations andmedications were updated in the EMR today and reviewed under the history and medication portions of their EMR.     ROS Negative, with the exception of above mentioned in HPI   Objective:  BP 116/80   Pulse 93   Temp 98.1 F (36.7 C)   Wt 150 lb (68 kg)   SpO2 99%   BMI 22.81 kg/m  Body mass index is 22.81 kg/m. Physical Exam Vitals and nursing note reviewed.  Constitutional:      General: She is not in acute distress.    Appearance: Normal appearance. She is normal weight. She is not ill-appearing or toxic-appearing.  HENT:     Head: Normocephalic and atraumatic.  Eyes:     General: No scleral icterus.       Right eye: No discharge.        Left eye: No discharge.     Extraocular Movements: Extraocular movements intact.     Conjunctiva/sclera: Conjunctivae normal.     Pupils: Pupils are equal, round, and reactive to light.  Cardiovascular:     Rate and Rhythm: Normal rate and regular rhythm.  Abdominal:     General: Abdomen is flat. There is no distension.     Palpations: Abdomen is soft.     Tenderness: There is abdominal tenderness (Suprapubic). There is left CVA tenderness. There is no right CVA tenderness, guarding or rebound.     Hernia: No hernia is present.  Skin:    Findings: No rash.  Neurological:     Mental Status: She is alert and oriented to person, place, and time. Mental status is at baseline.     Motor: No weakness.     Coordination: Coordination normal.     Gait: Gait normal.  Psychiatric:        Mood and Affect: Mood normal.        Behavior: Behavior normal.        Thought Content: Thought content normal.        Judgment: Judgment normal.     No results  found. No results found. Results for orders placed or performed in visit on 01/30/24 (from the past 24 hours)  POCT Urinalysis Dipstick (Automated)     Status: Abnormal   Collection Time: 01/30/24  2:25 PM  Result Value Ref Range   Color, UA yellow    Clarity, UA clear    Glucose, UA Negative Negative   Bilirubin, UA Negative    Ketones, UA Negative    Spec Grav, UA >=1.030 (A) 1.010 - 1.025   Blood, UA 3+    pH, UA 6.0 5.0 - 8.0   Protein, UA Positive (A) Negative   Urobilinogen, UA negative (A) 0.2 or 1.0 E.U./dL   Nitrite, UA negative    Leukocytes, UA Negative Negative    Assessment/Plan: NATALIYAH PACKHAM is a 48 y.o. female present for OV for  Hematuria, unspecified type/left flank pain Patient does have left CVA tenderness on exam today and suprapubic discomfort, along with hematuria for 2 days duration.  She has no prior history of kidney stones, however exam is consistent with possible kidney stone.  Elected to move forward with evaluation, while prophylactically treating for UTI  rest and hydrate well - POCT Urinalysis Dipstick (Automated)-3+ blood - Urinalysis w microscopic + reflex cultur-pending - CT RENAL STONE STUDY; Future - Basic Metabolic Panel (BMET) -Start Flomax  1 tab p.o. daily -Start Bactrim  DS 1 tab every 12 hours. - CT renal stone study ordered for CVA tenderness and hematuria. Patient was encouraged to start NSAID use if discomfort occurs Urine strainer provided today with instructions.  Reviewed expectations re: course of current medical issues. Discussed self-management of symptoms. Outlined signs and symptoms indicating need for more acute intervention. Patient verbalized understanding and all questions were answered. Patient received an After-Visit Summary.    Orders Placed This Encounter  Procedures   CT RENAL STONE STUDY   Urinalysis w microscopic + reflex cultur   Basic Metabolic Panel (BMET)   POCT Urinalysis Dipstick (Automated)    Meds ordered this encounter  Medications   tamsulosin  (FLOMAX ) 0.4 MG CAPS capsule    Sig: Take 1 capsule (0.4 mg total) by mouth daily.    Dispense:  30 capsule    Refill:  3   sulfamethoxazole -trimethoprim  (BACTRIM  DS) 800-160 MG tablet    Sig: Take 1 tablet by mouth 2 (two) times daily.    Dispense:  10 tablet    Refill:  0   Referral Orders  No referral(s) requested today     Note is dictated utilizing voice recognition software. Although note has been proof read prior to signing, occasional typographical errors still can be missed. If any questions arise, please do not hesitate to call for verification.   electronically signed by:  Charlies Bellini, DO  [facility] [place]

## 2024-01-31 ENCOUNTER — Encounter (INDEPENDENT_AMBULATORY_CARE_PROVIDER_SITE_OTHER): Payer: Self-pay | Admitting: Otolaryngology

## 2024-01-31 ENCOUNTER — Ambulatory Visit (INDEPENDENT_AMBULATORY_CARE_PROVIDER_SITE_OTHER): Admitting: Otolaryngology

## 2024-01-31 VITALS — BP 117/78 | HR 82

## 2024-01-31 DIAGNOSIS — J358 Other chronic diseases of tonsils and adenoids: Secondary | ICD-10-CM

## 2024-01-31 LAB — BASIC METABOLIC PANEL WITH GFR
BUN: 13 mg/dL (ref 6–23)
CO2: 28 meq/L (ref 19–32)
Calcium: 9.7 mg/dL (ref 8.4–10.5)
Chloride: 103 meq/L (ref 96–112)
Creatinine, Ser: 0.85 mg/dL (ref 0.40–1.20)
GFR: 82.53 mL/min (ref >60.00)
Glucose, Bld: 101 mg/dL — ABNORMAL HIGH (ref 70–99)
Potassium: 4.2 meq/L (ref 3.5–5.1)
Sodium: 139 meq/L (ref 135–145)

## 2024-01-31 NOTE — Progress Notes (Signed)
 ENT CONSULT:  Reason for Consult: tonsil cyst right side   HPI: Discussed the use of AI scribe software for clinical note transcription with the patient, who gave verbal consent to proceed.  History of Present Illness Deborah Mcpherson is a 48 year old female healthy who presents with a tonsil cyst on the right side.   She noticed a cyst on her tonsil approximately three years ago. The cyst is not painful unless she experiences a sore throat, at which point it becomes tender. There has been no increase in size or other bothersome symptoms associated with the cyst. She denies frequent sore throat or strep throat infections.  No pain with swallowing. No trouble with swallowing.    Records Reviewed:  Neurology office visit for migraine 10/31/23 Migraine without aura, without status migrainosus, not intractable  Right lumbar radiculopathy     Refer to PT for right lumbar radiculopathy Migraine prevention:  Qulipta  60mg  daily  Migraine rescue: sumatriptan  100mg , Continue Zofran  as needed.   Limit use of pain relievers to no more than 2 days out of week to prevent risk of rebound or medication-overuse headache. 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   Past Medical History:  Diagnosis Date   ADHD (attention deficit hyperactivity disorder)    Anxiety    Depression    Pristique and lexapro in past   Epistaxis 02/12/2021   Migraine syndrome    stress is trigger   Miscarriage 2001    Past Surgical History:  Procedure Laterality Date   DILATION AND CURETTAGE OF UTERUS     miscarriage   OOPHORECTOMY Left    tube and ovary removed--all benign   OVARIAN CYST REMOVAL     x 3    Family History  Problem Relation Age of Onset   Lung cancer Father    Alzheimer's disease Maternal Grandfather    Alcohol abuse Paternal Grandfather    Alcohol abuse Paternal Aunt    Alcohol abuse Paternal Uncle     Social History:  reports that she  quit smoking about 14 years ago. Her smoking use included cigarettes. She has never used smokeless tobacco. She reports current alcohol use. She reports that she does not use drugs.  Allergies:  Allergies  Allergen Reactions   Egg-Derived Products Swelling    Tongue and lips swell   Penicillins Hives and Anaphylaxis    Medications: I have reviewed the patient's current medications.  The PMH, PSH, Medications, Allergies, and SH were reviewed and updated.  ROS: Constitutional: Negative for fever, weight loss and weight gain. Cardiovascular: Negative for chest pain and dyspnea on exertion. Respiratory: Is not experiencing shortness of breath at rest. Gastrointestinal: Negative for nausea and vomiting. Neurological: Negative for headaches. Psychiatric: The patient is not nervous/anxious  Blood pressure 117/78, pulse 82, SpO2 98%.  PHYSICAL EXAM:  Exam: General: Well-developed, well-nourished Communication and Voice: Clear pitch and clarity Respiratory Respiratory effort: Equal inspiration and expiration without stridor Cardiovascular Peripheral Vascular: Warm extremities with equal color/perfusion Eyes: No nystagmus with equal extraocular motion bilaterally Neuro/Psych/Balance: Patient oriented to person, place, and time; Appropriate mood and affect; Gait is intact with no imbalance; Cranial nerves I-XII are intact Head and Face Inspection: Normocephalic and atraumatic without mass or lesion Palpation: Facial skeleton intact without bony stepoffs Salivary Glands: No mass or tenderness Facial Strength: Facial motility symmetric and full bilaterally ENT Pinna: External ear intact and fully developed External canal: Canal is patent with intact  skin Tympanic Membrane: Clear and mobile External Nose: No scar or anatomic deformity Internal Nose: Septum intact and midline. No edema, polyp, or rhinorrhea Lips, Teeth, and gums: Mucosa and teeth intact and viable TMJ: No pain to  palpation with full mobility Oral cavity/oropharynx: No erythema or exudate, no lesions present 2+ tonsils 0.5 cm tonsil cyst on the right side Neck Neck and Trachea: Midline trachea without mass or lesion Thyroid : No mass or nodularity Lymphatics: No lymphadenopathy   Assessment/Plan: Encounter Diagnosis  Name Primary?   Tonsillar cyst Yes    Assessment and Plan Assessment & Plan Benign tonsil cyst on the right side Cyst present for three years, benign, slightly enlarged, non-painful unless sore throat present. - Reassured cyst is benign, no removal needed.    Thank you for allowing me to participate in the care of this patient. Please do not hesitate to contact me with any questions or concerns.   Elena Larry, MD Otolaryngology [facility] Phone: [phone] Fax: [phone]    01/31/2024, 8:15 AM

## 2024-02-01 ENCOUNTER — Encounter: Payer: Self-pay | Admitting: Family Medicine

## 2024-02-01 ENCOUNTER — Ambulatory Visit: Payer: Self-pay | Admitting: Family Medicine

## 2024-02-01 LAB — URINALYSIS W MICROSCOPIC + REFLEX CULTURE
Bacteria, UA: NONE SEEN /HPF
Bilirubin Urine: NEGATIVE
Glucose, UA: NEGATIVE
Ketones, ur: NEGATIVE
Leukocyte Esterase: NEGATIVE
Nitrites, Initial: NEGATIVE
Specific Gravity, Urine: 1.025 (ref 1.001–1.035)
pH: 5.5 (ref 5.0–8.0)

## 2024-02-01 LAB — URINE CULTURE
MICRO NUMBER:: 16728461
Result:: NO GROWTH
SPECIMEN QUALITY:: ADEQUATE

## 2024-02-01 LAB — CULTURE INDICATED

## 2024-02-01 MED ORDER — FLUCONAZOLE 150 MG PO TABS: 150.0000 mg | ORAL_TABLET | Freq: Once | ORAL | 0 refills | Status: AC

## 2024-02-01 NOTE — Telephone Encounter (Signed)
 Please call patient Her kidney function is stable. Her urine culture did not show evidence of infection, blood was present as well as many white blood cells which in this case are evidence of inflammation.  I called in a Diflucan  tablet today, in the event she is having some vaginal discharge or yeast symptoms causing her discomfort.  We will not know more or if this is a kidney stone until she either passes a kidney stone or she obtains the CT.  I would recommend she continue the Bactrim  antibiotic.  I am worried she has a stone, however with the bleeding present it can lead to a kidney infection.   With her tenderness on exam in her left lower back I do highly recommend she have the CT completed as soon as possible.

## 2024-02-02 ENCOUNTER — Telehealth: Payer: Self-pay | Admitting: Pharmacy Technician

## 2024-02-02 ENCOUNTER — Ambulatory Visit
Admission: RE | Admit: 2024-02-02 | Discharge: 2024-02-02 | Disposition: A | Discharge status: Home / Self Care | Source: Ambulatory Visit | Attending: Family Medicine | Admitting: Family Medicine

## 2024-02-02 ENCOUNTER — Other Ambulatory Visit (HOSPITAL_COMMUNITY): Payer: Self-pay

## 2024-02-02 DIAGNOSIS — R109 Unspecified abdominal pain: Secondary | ICD-10-CM

## 2024-02-02 DIAGNOSIS — R10812 Left upper quadrant abdominal tenderness: Secondary | ICD-10-CM | POA: Diagnosis not present

## 2024-02-02 DIAGNOSIS — R319 Hematuria, unspecified: Secondary | ICD-10-CM

## 2024-02-02 NOTE — Telephone Encounter (Signed)
 Pharmacy Patient Advocate Encounter   Received notification from CoverMyMeds that prior authorization for QULIPTA  60MG  is required/requested.   Insurance verification completed.   The patient is insured through [facility] .   Per test claim: PA required; PA submitted to above mentioned insurance via CoverMyMeds Key/confirmation #/EOC [id number] Status is pending

## 2024-02-03 ENCOUNTER — Other Ambulatory Visit (HOSPITAL_COMMUNITY): Payer: Self-pay

## 2024-02-03 NOTE — Telephone Encounter (Signed)
 Pharmacy Patient Advocate Encounter  Received notification from [facility] that Prior Authorization for QULIPTA  60MG  has been APPROVED from 7.24.25 to 7.24.26. Unable to obtain price due to refill too soon rejection, last fill date 7.23.25 next available fill date8.15.25   PA #/Case ID/Reference #: [id number]

## 2024-02-03 NOTE — Telephone Encounter (Signed)
 [employer] cld Rx Approved Letter faxed

## 2024-02-17 ENCOUNTER — Encounter: Payer: Self-pay | Admitting: Family Medicine

## 2024-02-17 ENCOUNTER — Ambulatory Visit (INDEPENDENT_AMBULATORY_CARE_PROVIDER_SITE_OTHER): Admitting: Family Medicine

## 2024-02-17 VITALS — BP 101/70 | HR 73 | Temp 98.2°F | Resp 14 | Ht 68.0 in | Wt 150.6 lb

## 2024-02-17 DIAGNOSIS — R311 Benign essential microscopic hematuria: Secondary | ICD-10-CM | POA: Diagnosis not present

## 2024-02-17 LAB — POC URINALSYSI DIPSTICK (AUTOMATED)
Bilirubin, UA: NEGATIVE
Glucose, UA: NEGATIVE
Ketones, UA: NEGATIVE
Leukocytes, UA: NEGATIVE
Nitrite, UA: NEGATIVE
Protein, UA: NEGATIVE
Spec Grav, UA: 1.02 (ref 1.010–1.025)
Urobilinogen, UA: NEGATIVE U/dL — AB
pH, UA: 6 (ref 5.0–8.0)

## 2024-02-17 NOTE — Progress Notes (Signed)
 Deborah Mcpherson , [birth year], 48 y.o., female MRN: [id number] Patient Care Team    Relationship Specialty Notifications Start End  Catherine Charlies LABOR, DO PCP - General Family Medicine  03/18/16   Lenon Oneil BRAVO, MD Consulting Physician Obstetrics and Gynecology  08/26/17   Vincente Grip, MD Consulting Physician Psychiatry  08/26/17    Comment: ADD, depression/anxiety  Skeet Juliene SAUNDERS, DO Consulting Physician Neurology  09/07/21   Christyne Finder DDS Referring Physician Dentistry  03/03/22     Chief Complaint  Patient presents with   Hematuria    Pt denies any further blood in urine     Subjective: Deborah Mcpherson is a 48 y.o. Pt presents for an OV  To follow up on left flank pain with hematuria. CT reassuring . No hydronephrosis or kidney stone appreciated.  BMP and urine culture were normal.  Patient was treated with antibiotic prophylaxis with Bactrim  and started on Flomax . Today patient reports she is feeling much better. Flank pain and gross hematuria have resolved. No fever or frequency orf urination .   Follow up: with complaints of hematuria of 2 days duration.  Associated symptoms include dysuria. Patient reports last week she has noticed needing to more frequently directed bathroom and feeling like she was not drinking her bladder all the way. Over the last 2 days she has noticed blood on her toilet tissue after wiping with urination.  She reports noting some stringy appearing blood in the toilet with her urine.  She is on the birth control pill and it is not time for her menses. Patient denies fever, chills, nausea.  She does endorse mild lower back pain she contributed to her sciatica.  She does endorse mild suprapubic tenderness. She has no prior history of kidney stones.  She reports she does not drink soda, but she does drink a lot of milk.   Latest Reference Range & Units 01/30/24 14:25  Bilirubin, UA  Negative  Clarity, UA  clear  Color, UA  yellow  Glucose Negative   Negative  Ketones, UA  Negative  Leukocytes,UA Negative  Negative  Nitrite, UA  negative  pH, UA 5.0 - 8.0  6.0  Protein,UA Negative  Positive !  Specific Gravity, UA 1.010 - 1.025  >=1.030 !  Urobilinogen, UA 0.2 or 1.0 E.U./dL negative !  RBC, UA  3+    Latest Reference Range & Units 01/30/24 14:18  Bacteria, UA NONE SEEN /HPF NONE SEEN  Hyaline Cast NONE SEEN /LPF 0-5 !  RBC / HPF 0 - 2 /HPF 3-10 !  REFLEXIVE URINE CULTURE  Pend  Squamous Epithelial / HPF < OR = 5 /HPF 0-5  WBC, UA 0 - 5 /HPF 20-40 !  Urine cx> no growth  CT 02/02/2024: FINDINGS: Of note, the lack of intravenous contrast limits evaluation of the solid organ parenchyma and vascularity.   Lower chest: No focal airspace consolidation or pleural effusion.   Hepatobiliary: No mass.No radiopaque stones or wall thickening of the gallbladder. No intrahepatic or extrahepatic biliary ductal dilation.   Pancreas: No mass or main ductal dilation. No peripancreatic inflammation or fluid collection.   Spleen: Normal size. No mass.   Adrenals/Urinary Tract: No adrenal masses. No renal mass. No hydronephrosis or nephrolithiasis. The urinary bladder is completely decompressed.   Stomach/Bowel: The stomach is decompressed without focal abnormality. No small bowel wall thickening or inflammation. No small bowel obstruction.Normal appendix.   Vascular/Lymphatic: No aortic aneurysm. No intraabdominal or  pelvic lymphadenopathy.   Reproductive: The uterus and ovaries are within normal limits for patient's age. No free pelvic fluid.   Other: No pneumoperitoneum, ascites, or mesenteric inflammation.   Musculoskeletal: No acute fracture or destructive lesion. Unchanged mild height loss of the L5 vertebral body. Scattered bone islands throughout the visualized skeleton. Degenerative disc disease at L5-S1.   IMPRESSION: No acute intra-abdominal or pelvic abnormality.     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 No drugs.      Right handed   Past Medical History:  Diagnosis Date   ADHD (attention deficit hyperactivity disorder)    Anxiety    Depression    Pristique and lexapro in past   Epistaxis 02/12/2021   Migraine syndrome    stress is trigger   Miscarriage 2001   Past Surgical History:  Procedure Laterality Date   DILATION AND CURETTAGE OF UTERUS     miscarriage   OOPHORECTOMY Left    tube and ovary removed--all benign   OVARIAN CYST REMOVAL     x 3   Family History  Problem Relation Age of Onset   Lung cancer Father    Alzheimer's disease Maternal Grandfather    Alcohol abuse Paternal Grandfather    Alcohol abuse Paternal Aunt    Alcohol abuse Paternal Uncle    Allergies as of 02/17/2024       Reactions   Egg-derived Products Swelling   Tongue and lips swell   Penicillins Hives, Anaphylaxis        Medication List        Accurate as of February 17, 2024  1:15 PM. If you have any questions, ask your nurse or doctor.          STOP taking these medications    sulfamethoxazole -trimethoprim  800-160 MG tablet Commonly known as: BACTRIM  DS Stopped by: Charlies Bellini   tamsulosin  0.4 MG Caps capsule Commonly known as: FLOMAX  Stopped by: Charlies Bellini       TAKE these medications    amphetamine-dextroamphetamine 30 MG tablet Commonly known as: ADDERALL Take 30 mg by mouth 2 (two) times daily.   b complex vitamins tablet Take 1 tablet by mouth  daily.   buPROPion 300 MG 24 hr tablet Commonly known as: WELLBUTRIN XL Take 300 mg by mouth at bedtime.   EPINEPHrine  0.3 mg/0.3 mL Soaj injection Commonly known as: EPI-PEN Inject 0.3 mg into the muscle as needed for anaphylaxis.   norethindrone-ethinyl estradiol 1-20 MG-MCG tablet Commonly known as: LOESTRIN Take 1 tablet by mouth daily.   Qulipta  60 MG Tabs Generic drug: Atogepant  TAKE 1 TABLET (60 MG TOTAL) BY MOUTH DAILY AT 2 PM.   SUMAtriptan  100 MG tablet Commonly known as: IMITREX  Take 1 tablet (100 mg total) by mouth as needed for migraine. May repeat after 2 hours.  Maximum 2 tablets in  24 hours.        All past medical history, surgical history, allergies, family history, immunizations andmedications were updated in the EMR today and reviewed under the history and medication portions of their EMR.     ROS Negative, with the exception of above mentioned in HPI   Objective:  BP 101/70   Pulse 73   Temp 98.2 F (36.8 C) (Oral)   Resp 14   Ht 5' 8 (1.727 m)   Wt 150 lb 9.6 oz (68.3 kg)   SpO2 100%   BMI 22.90 kg/m  Body mass index is 22.9 kg/m. Physical Exam Vitals and nursing note reviewed.  Constitutional:      General: She is not in acute distress.    Appearance: Normal appearance. She is normal weight. She is not ill-appearing or toxic-appearing.  HENT:     Head: Normocephalic and atraumatic.  Eyes:     General: No scleral icterus.       Right eye: No discharge.        Left eye: No discharge.     Extraocular Movements: Extraocular movements intact.     Conjunctiva/sclera: Conjunctivae normal.     Pupils: Pupils are equal, round, and reactive to light.  Cardiovascular:     Rate and Rhythm: Normal rate and regular rhythm.  Abdominal:     General: Abdomen is flat. There is no distension.     Palpations: Abdomen is soft.     Tenderness: There is no abdominal tenderness (Suprapubic). There is no right CVA tenderness, left CVA tenderness,  guarding or rebound.     Hernia: No hernia is present.  Skin:    Findings: No rash.  Neurological:     Mental Status: She is alert and oriented to person, place, and time. Mental status is at baseline.     Motor: No weakness.     Coordination: Coordination normal.     Gait: Gait normal.  Psychiatric:        Mood and Affect: Mood normal.        Behavior: Behavior normal.        Thought Content: Thought content normal.        Judgment: Judgment normal.     No results found. No results found. Results for orders placed or performed in visit on 02/17/24 (from the past 24 hours)  POCT Urinalysis Dipstick (Automated)     Status: Abnormal   Collection Time: 02/17/24  1:01 PM  Result Value Ref Range   Color, UA yellow    Clarity, UA clear    Glucose, UA Negative Negative   Bilirubin, UA negative    Ketones, UA negative    Spec Grav, UA 1.020 1.010 - 1.025   Blood, UA 1+    pH, UA 6.0 5.0 - 8.0   Protein, UA Negative Negative   Urobilinogen, UA negative (A) 0.2 or 1.0 E.U./dL   Nitrite, UA negative    Leukocytes, UA Negative Negative     Assessment/Plan: Deborah Mcpherson is a 48 y.o. female present for OV for  Hematuria, unspecified type/left flank pain Resolved on exam today-Left CVA tenderness  Discussed dietary triggers Continue to hydrate well -Point-of-care urine today+1 bld, improved.  - CT RENAL STONE STUDY 01/30/2024 reassuring no hydronephrosis, no masses or kidney stones. - Basic Metabolic Panel (BMET) within normal limits - urinalysis pending.     Reviewed expectations re: course of current medical issues. Discussed self-management of symptoms. Outlined signs and symptoms indicating need for  more acute intervention. Patient verbalized understanding and all questions were answered. Patient received an After-Visit Summary.    Orders Placed This Encounter  Procedures   Urinalysis w microscopic + reflex cultur   POCT Urinalysis Dipstick (Automated)   No  orders of the defined types were placed in this encounter.  Referral Orders  No referral(s) requested today     Note is dictated utilizing voice recognition software. Although note has been proof read prior to signing, occasional typographical errors still can be missed. If any questions arise, please do not hesitate to call for verification.   electronically signed by:  Charlies Bellini, DO   Primary Care - [place]

## 2024-02-17 NOTE — Patient Instructions (Addendum)

## 2024-02-18 LAB — URINALYSIS W MICROSCOPIC + REFLEX CULTURE
Bacteria, UA: NONE SEEN /HPF
Bilirubin Urine: NEGATIVE
Glucose, UA: NEGATIVE
Hgb urine dipstick: NEGATIVE
Hyaline Cast: NONE SEEN /LPF
Ketones, ur: NEGATIVE
Leukocyte Esterase: NEGATIVE
Nitrites, Initial: NEGATIVE
Protein, ur: NEGATIVE
Specific Gravity, Urine: 1.023 (ref 1.001–1.035)
WBC, UA: NONE SEEN /HPF (ref 0–5)
pH: 6 (ref 5.0–8.0)

## 2024-02-18 LAB — NO CULTURE INDICATED

## 2024-02-20 ENCOUNTER — Ambulatory Visit: Payer: Self-pay | Admitting: Family Medicine

## 2024-05-07 ENCOUNTER — Ambulatory Visit: Admitting: Neurology

## 2024-06-25 ENCOUNTER — Encounter: Payer: Self-pay | Admitting: Neurology

## 2024-06-25 DIAGNOSIS — M5416 Radiculopathy, lumbar region: Secondary | ICD-10-CM

## 2024-07-03 ENCOUNTER — Encounter: Payer: Self-pay | Admitting: Neurology

## 2024-07-09 ENCOUNTER — Other Ambulatory Visit

## 2024-07-10 ENCOUNTER — Inpatient Hospital Stay: Admission: RE | Admit: 2024-07-10 | Discharge: 2024-07-10 | Attending: Neurology

## 2024-07-10 DIAGNOSIS — M5416 Radiculopathy, lumbar region: Secondary | ICD-10-CM

## 2024-07-11 NOTE — Progress Notes (Signed)
 "  NEUROLOGY FOLLOW UP OFFICE NOTE  Deborah Mcpherson [id number]  Assessment/Plan:   Migraine without aura, without status migrainosus, not intractable  Right lumbar radiculopathy - while imaging does not reveal any structural cause for pain impinging the nerve root, it is possible that when she injured her back a year ago, she had a disc bulge that has since slipped back, possibly at the L4-L5 level where there is an annular tear.   Regarding lumbar radiculopathy:  will check NCV-EMG of right lower extremity to evaluate for radiculopathy.  Then consider referral to pain management for consideration of epidural injection. Migraine prevention:  Qulipta  60mg  daily  Migraine rescue: sumatriptan  100mg , Continue Zofran  as needed.   Limit use of pain relievers to no more than 9 days out of the month to prevent risk of rebound or medication-overuse headache. 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       Subjective:  Deborah Mcpherson is a 48 year old female with ADHD, depression, anxiety who follows up for migraines and lumbar radiculopathy.  MRI L-spine personally reviewed.   UPDATE: Migraine: Continues to do well. Intensity:  severe Duration:  60 minutes with sumatriptan .   Frequency:  1 a month or less Frequency of abortive medication: 2 days in last 4 weeks Current NSAIDS/analgesics:  none Current triptans:  sumatriptan  100mg  Current ergotamine:  none Current anti-emetic:  Zofran  ODT 4mg  Current muscle relaxants:  none Current Antihypertensive medications:  baseline low blood pressure Current Antidepressant medications:  bupropion XL 300mg  daily Current Anticonvulsant medications:  none Current anti-CGRP:  Qulipta  60mg  daily Current Vitamins/Herbal/Supplements:  B Current Antihistamines/Decongestants:  hydroxyzine  Other therapy:  TMJ mouth guard Hormone/birth control:  LOESTRIN Other medications:  Adderall   Caffeine:  1 cup some  mornings.  Tea daily Diet:  4-5 16oz bottles water daily.  Does not skip meals.  She has quit drinking alcohol.   Exercise:  run Depression:  no; Anxiety:  no but work-related stress Sleep hygiene:  good  Lumbar radiculopathy: Referred to physical therapy.  No improvement.  MRI of lumbar spine without contrast on 07/10/2024 showed some degenerative changes from L3-4 through L5-S1 including a mild disc bulge and small right lateral recess annular tear but no impingement.   HISTORY:  Migraines: Onset:  Since highschool.  Progressively worse over the years..  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   Location:  right frontal Quality:  pounding/throbbing Intensity:  7/10.  She denies new headache, thunderclap headache or severe headache that wakes her from sleep. Aura:  absent Prodrome:  absent Associated symptoms:  nausea, sometimes vomiting, osmophobia, photophobia, phonophobia, sometimes right eye twitches.  She denies associated visual disturbance, unilateral numbness or weakness. Duration:  at least 4 hours. Frequency:  3 days a week Frequency of abortive medication: she was taking Motrin 3 days a week Triggers:  stress, perfumes Relieving factors:  nothing Activity:  aggravates   Lumbar radiculopathy: Back pain started around December 2024.  No specific injury but may be related to holding her border collie.  If she sits for about 20-30 minutes, she will develop numbness in the right buttock and shooting pain down the lateral leg to the knee.  Once she walks it resolves.  When she lies down, she may feel a twitch in the left lower back, but not very often.    Past medications: Past NSAIDS/analgesics:  BC powder, acetaminophen  Past abortive triptans:  rizatriptan  Past abortive ergotamine:  none Past muscle relaxants:  none Past anti-emetic:  none Past antihypertensive medications:  none Past antidepressant medications:   duloxetine Past anticonvulsant medications:  none Past anti-CGRP:  Ajovy  (effective but caused itching) Past vitamins/Herbal/Supplements:  none Past antihistamines/decongestants:  Flonase  Other past therapies:  none     Family history of headache:  mom (migraine)  PAST MEDICAL HISTORY: Past Medical History:  Diagnosis Date   ADHD (attention deficit hyperactivity disorder)    Anxiety    Depression    Pristique and lexapro in past   Epistaxis 02/12/2021   Migraine syndrome    stress is trigger   Miscarriage 2001    MEDICATIONS: Current Outpatient Medications on File Prior to Visit  Medication Sig Dispense Refill   amphetamine-dextroamphetamine (ADDERALL) 30 MG tablet Take 30 mg by mouth 2 (two) times daily.     Atogepant  (QULIPTA ) 60 MG TABS TAKE 1 TABLET (60 MG TOTAL) BY MOUTH DAILY AT 2 PM. 30 tablet 4   b complex vitamins tablet Take 1 tablet by mouth daily.     buPROPion (WELLBUTRIN XL) 300 MG 24 hr tablet Take 300 mg by mouth at bedtime.     EPINEPHrine  0.3 mg/0.3 mL IJ SOAJ injection Inject 0.3 mg into the muscle as needed for anaphylaxis. 1 each 0   norethindrone-ethinyl estradiol (LOESTRIN) 1-20 MG-MCG tablet Take 1 tablet by mouth daily.     SUMAtriptan  (IMITREX ) 100 MG tablet Take 1 tablet (100 mg total) by mouth as needed for migraine. May repeat after 2 hours.  Maximum 2 tablets in 24 hours. 10 tablet 5   No current facility-administered medications on file prior to visit.    ALLERGIES: Allergies  Allergen Reactions   Egg Protein-Containing Drug Products Swelling    Tongue and lips swell   Penicillins Hives and Anaphylaxis    FAMILY HISTORY: Family History  Problem Relation Age of Onset   Lung cancer Father    Alzheimer's disease Maternal Grandfather    Alcohol abuse Paternal Grandfather    Alcohol abuse Paternal Aunt    Alcohol abuse Paternal Uncle       Objective:  Blood pressure 112/70, pulse 95, height 5' 9 (1.753 m), weight 164 lb 12.8 oz (74.8  kg), SpO2 99%. General: No acute distress.  Patient appears well-groomed.      Juliene Dunnings, DO  CC: Charlies Bellini, DO       "

## 2024-07-16 ENCOUNTER — Ambulatory Visit: Admitting: Neurology

## 2024-07-16 VITALS — BP 112/70 | HR 95 | Ht 69.0 in | Wt 164.8 lb

## 2024-07-16 DIAGNOSIS — M5416 Radiculopathy, lumbar region: Secondary | ICD-10-CM | POA: Diagnosis not present

## 2024-07-16 DIAGNOSIS — G43009 Migraine without aura, not intractable, without status migrainosus: Secondary | ICD-10-CM

## 2024-07-16 MED ORDER — SUMATRIPTAN SUCCINATE 100 MG PO TABS: 100.0000 mg | ORAL_TABLET | ORAL | 5 refills | Status: AC | PRN

## 2024-07-16 MED ORDER — QULIPTA 60 MG PO TABS: 60.0000 mg | ORAL_TABLET | Freq: Every day | ORAL | 5 refills | Status: AC

## 2024-07-16 NOTE — Patient Instructions (Signed)
 Follow-up 6 months

## 2024-07-17 ENCOUNTER — Ambulatory Visit: Payer: Self-pay | Admitting: Neurology

## 2024-07-17 ENCOUNTER — Encounter: Payer: Self-pay | Admitting: Neurology

## 2024-07-17 DIAGNOSIS — M5416 Radiculopathy, lumbar region: Secondary | ICD-10-CM

## 2024-07-17 NOTE — Telephone Encounter (Signed)
-----   Message from Juliene Dunnings, DO sent at 07/17/2024 12:25 PM EST ----- The mri does not reveal anything pressing on a nerve.  However, there is tear to the outer covering of one of her discs. It is not pressing on a nerve now, but possibly last year when she developed  the back pain, the disc did slip out pressing on the nerve and has since slipped back in.  Before referring to pain management for consideration of possible injection, I would like to check a nerve  conduction study of the right leg to see if there are chronic changes of the nerve corresponding to her symptoms (reason:  right lumbar radiculopathy)

## 2024-07-17 NOTE — Telephone Encounter (Signed)
 Patient advised. Patient agree to the EMG.    Front desk please call to Schedule EMG.

## 2024-11-12 ENCOUNTER — Encounter: Admitting: Family Medicine

## 2025-01-28 ENCOUNTER — Ambulatory Visit: Admitting: Neurology

## 2025-01-29 ENCOUNTER — Ambulatory Visit: Payer: Self-pay | Admitting: Neurology
